# Patient Record
Sex: Female | Born: 1950
Health system: Southern US, Community
[De-identification: ages and names within clinical notes are randomized; demographics above are authoritative.]

## PROBLEM LIST (undated history)

## (undated) DIAGNOSIS — M810 Age-related osteoporosis without current pathological fracture: Secondary | ICD-10-CM

## (undated) DIAGNOSIS — G4730 Sleep apnea, unspecified: Secondary | ICD-10-CM

## (undated) DIAGNOSIS — M1990 Unspecified osteoarthritis, unspecified site: Secondary | ICD-10-CM

## (undated) DIAGNOSIS — F419 Anxiety disorder, unspecified: Secondary | ICD-10-CM

## (undated) HISTORY — DX: Sleep apnea, unspecified: G47.30

## (undated) HISTORY — PX: ABDOMINAL HYSTERECTOMY: SHX81

## (undated) HISTORY — DX: Anxiety disorder, unspecified: F41.9

## (undated) HISTORY — DX: Age-related osteoporosis without current pathological fracture: M81.0

## (undated) HISTORY — DX: Unspecified osteoarthritis, unspecified site: M19.90

## (undated) HISTORY — PX: POLYPECTOMY: SHX149

---

## 1983-11-03 HISTORY — PX: FINGER SURGERY: SHX640

## 1999-03-06 ENCOUNTER — Other Ambulatory Visit: Admission: RE | Admit: 1999-03-06 | Discharge: 1999-03-06 | Payer: Self-pay | Admitting: Orthopedic Surgery

## 2000-01-16 ENCOUNTER — Ambulatory Visit (HOSPITAL_COMMUNITY): Admission: RE | Admit: 2000-01-16 | Discharge: 2000-01-16 | Payer: Self-pay | Admitting: Gastroenterology

## 2000-01-16 ENCOUNTER — Encounter (INDEPENDENT_AMBULATORY_CARE_PROVIDER_SITE_OTHER): Payer: Self-pay | Admitting: Specialist

## 2000-11-08 ENCOUNTER — Encounter: Payer: Self-pay | Admitting: Family Medicine

## 2000-11-08 ENCOUNTER — Encounter: Admission: RE | Admit: 2000-11-08 | Discharge: 2000-11-08 | Payer: Self-pay | Admitting: Family Medicine

## 2001-12-21 ENCOUNTER — Encounter: Payer: Self-pay | Admitting: Family Medicine

## 2001-12-21 ENCOUNTER — Encounter: Admission: RE | Admit: 2001-12-21 | Discharge: 2001-12-21 | Payer: Self-pay | Admitting: Family Medicine

## 2004-11-02 DIAGNOSIS — G4730 Sleep apnea, unspecified: Secondary | ICD-10-CM

## 2004-11-02 HISTORY — DX: Sleep apnea, unspecified: G47.30

## 2005-03-13 ENCOUNTER — Ambulatory Visit: Payer: Self-pay | Admitting: Gastroenterology

## 2005-03-27 ENCOUNTER — Ambulatory Visit: Payer: Self-pay | Admitting: Gastroenterology

## 2008-08-01 ENCOUNTER — Emergency Department (HOSPITAL_BASED_OUTPATIENT_CLINIC_OR_DEPARTMENT_OTHER): Admission: EM | Admit: 2008-08-01 | Discharge: 2008-08-01 | Payer: Self-pay | Admitting: Emergency Medicine

## 2009-11-02 HISTORY — PX: COLONOSCOPY: SHX174

## 2010-03-26 ENCOUNTER — Encounter (INDEPENDENT_AMBULATORY_CARE_PROVIDER_SITE_OTHER): Payer: Self-pay | Admitting: *Deleted

## 2010-04-10 ENCOUNTER — Encounter (INDEPENDENT_AMBULATORY_CARE_PROVIDER_SITE_OTHER): Payer: Self-pay | Admitting: *Deleted

## 2010-04-17 ENCOUNTER — Encounter (INDEPENDENT_AMBULATORY_CARE_PROVIDER_SITE_OTHER): Payer: Self-pay

## 2010-04-18 ENCOUNTER — Ambulatory Visit: Payer: Self-pay | Admitting: Gastroenterology

## 2010-04-28 ENCOUNTER — Ambulatory Visit: Payer: Self-pay | Admitting: Gastroenterology

## 2010-04-30 ENCOUNTER — Encounter: Payer: Self-pay | Admitting: Gastroenterology

## 2010-09-01 ENCOUNTER — Emergency Department (HOSPITAL_BASED_OUTPATIENT_CLINIC_OR_DEPARTMENT_OTHER): Admission: EM | Admit: 2010-09-01 | Discharge: 2010-09-02 | Payer: Self-pay | Admitting: Emergency Medicine

## 2010-09-01 ENCOUNTER — Ambulatory Visit: Payer: Self-pay | Admitting: Diagnostic Radiology

## 2010-12-02 NOTE — Miscellaneous (Signed)
Summary: Lec previsit  Clinical Lists Changes  Medications: Added new medication of MOVIPREP 100 GM  SOLR (PEG-KCL-NACL-NASULF-NA ASC-C) As per prep instructions. - Signed Rx of MOVIPREP 100 GM  SOLR (PEG-KCL-NACL-NASULF-NA ASC-C) As per prep instructions.;  #1 x 0;  Signed;  Entered by: Ulis Rias RN;  Authorized by: Mardella Layman MD [facility];  Method used: [facility]  [id number]*, [address] [place], [place]  [address], Ph: [phone] or [phone], Fax: [phone] Observations: Added new observation of NKA: T (04/18/2010 16:23)    Prescriptions: MOVIPREP 100 GM  SOLR (PEG-KCL-NACL-NASULF-NA ASC-C) As per prep instructions.  #1 x 0   Entered by:   Ulis Rias RN   Authorized by:   Mardella Layman MD [facility]   Signed by:   Ulis Rias RN on 04/18/2010   Method used:   [facility]ronically to        [facility]  [phone]* (retail)       [address]       [place], [place]  [address]       Ph: [phone] or [phone]       Fax: [phone]   RxID:   [id number]

## 2010-12-02 NOTE — Letter (Signed)
Summary: [facility] Instructions  [facility]  [address] [place], [place] [address]   Phone: [phone]  Fax: [phone]       Rebekah Chapman    [birth year]    MRN: [id number]        Procedure Day /Date: Monday 04/28/2010     Arrival Time: 9:00 am      Procedure Time: 10:00 am     Location of Procedure:                    _x _  [facility] (4th Floor)                        PREPARATION FOR COLONOSCOPY WITH MOVIPREP   Starting 5 days prior to your procedure Wednesday 6/22 do not eat nuts, seeds, popcorn, corn, beans, peas,  salads, or any raw vegetables.  Do not take any fiber supplements (e.g. Metamucil, Citrucel, and Benefiber).  THE DAY BEFORE YOUR PROCEDURE         DATE: Sunday 6/26  1.  Drink clear liquids the entire day-NO SOLID FOOD  2.  Do not drink anything colored red or purple.  Avoid juices with pulp.  No orange juice.  3.  Drink at least 64 oz. (8 glasses) of fluid/clear liquids during the day to prevent dehydration and help the prep work efficiently.  CLEAR LIQUIDS INCLUDE: Water Jello Ice Popsicles Tea (sugar ok, no milk/cream) Powdered fruit flavored drinks Coffee (sugar ok, no milk/cream) Gatorade Juice: apple, white grape, white cranberry  Lemonade Clear bullion, consomm, broth Carbonated beverages (any kind) Strained chicken noodle soup Hard Candy                             4.  In the morning, mix first dose of MoviPrep solution:    Empty 1 Pouch A and 1 Pouch B into the disposable container    Add lukewarm drinking water to the top line of the container. Mix to dissolve    Refrigerate (mixed solution should be used within 24 hrs)  5.  Begin drinking the prep at 5:00 p.m. The MoviPrep container is divided by 4 marks.   Every 15 minutes drink the solution down to the next mark (approximately 8 oz) until the full liter is complete.   6.  Follow completed prep with 16 oz of clear liquid of your choice  (Nothing red or purple).  Continue to drink clear liquids until bedtime.  7.  Before going to bed, mix second dose of MoviPrep solution:    Empty 1 Pouch A and 1 Pouch B into the disposable container    Add lukewarm drinking water to the top line of the container. Mix to dissolve    Refrigerate  THE DAY OF YOUR PROCEDURE      DATE: Monday 6/27  Beginning at 5:00 a.m. (5 hours before procedure):         1. Every 15 minutes, drink the solution down to the next mark (approx 8 oz) until the full liter is complete.  2. Follow completed prep with 16 oz. of clear liquid of your choice.    3. You may drink clear liquids until 8:00 am (2 HOURS BEFORE PROCEDURE).   MEDICATION INSTRUCTIONS  Unless otherwise instructed, you should take regular prescription medications with a small sip of water   as early as possible the morning of your  procedure.         OTHER INSTRUCTIONS  You will need a responsible adult at least 59 years of age to accompany you and drive you home.   This person must remain in the waiting room during your procedure.  Wear loose fitting clothing that is easily removed.  Leave jewelry and other valuables at home.  However, you may wish to bring a book to read or  an iPod/MP3 player to listen to music as you wait for your procedure to start.  Remove all body piercing jewelry and leave at home.  Total time from sign-in until discharge is approximately 2-3 hours.  You should go home directly after your procedure and rest.  You can resume normal activities the  day after your procedure.  The day of your procedure you should not:   Drive   Make legal decisions   Operate machinery   Drink alcohol   Return to work  You will receive specific instructions about eating, activities and medications before you leave.    The above instructions have been reviewed and explained to me by   Ulis Rias RN  April 18, 2010 4:50 PM     I fully understand and can  verbalize these instructions _____________________________ Date _________

## 2010-12-02 NOTE — Procedures (Signed)
Summary: Colonoscopy  Patient: [name] Note: All result statuses are Final unless otherwise noted.  Tests: (1) Colonoscopy (COL)   COL Colonoscopy           DONE (C)     [facility]     [address]     [place], [place]  [address]           COLONOSCOPY PROCEDURE REPORT           PATIENT:  Rebekah Chapman, Rebekah Chapman  MR#:  [id number]     BIRTHDATE:  May 10, 1951, 59 yrs. old  GENDER:  female     ENDOSCOPIST:  Vania Rea. Jarold Motto, MD, [facility]     REF. BY:     PROCEDURE DATE:  04/28/2010     PROCEDURE:  Colonoscopy with biopsy     ASA CLASS:  Class II     INDICATIONS:  history of pre-cancerous (adenomatous) colon polyps           MEDICATIONS:   Fentanyl 75 mcg IV, Versed 8 mg IV           DESCRIPTION OF PROCEDURE:   After the risks benefits and     alternatives of the procedure were thoroughly explained, informed     consent was obtained.  Digital rectal exam was performed and     revealed no abnormalities.   The [id number] [id number] endoscope was     introduced through the anus and advanced to the cecum, which was     identified by both the appendix and ileocecal valve, without     limitations.  The quality of the prep was excellent, using     MoviPrep.  The instrument was then slowly withdrawn as the colon     was fully examined.     <<PROCEDUREIMAGES>>           FINDINGS:  A sessile polyp was found. 2 mm diminutive polyp cold     excised at hepatic flexure area.  This was otherwise a normal     examination of the colon.   Retroflexed views in the rectum     revealed no abnormalities.    The scope was then withdrawn from     the patient and the procedure completed.           COMPLICATIONS:  None     ENDOSCOPIC IMPRESSION:     1) Sessile polyp     2) Otherwise normal examination     r/o adenoma     RECOMMENDATIONS:     1) Follow up colonoscopy in 5 years     REPEAT EXAM:  No           ______________________________     Vania Rea. Jarold Motto, MD, [facility]           CC:  Marinda Elk, MD           n.     REVISED:  04/28/2010 10:03 AM     eSIGNED:   Vania Rea. Patterson at 04/28/2010 10:03 AM           Lauretta Grill, [id number]  Note: An exclamation mark (!) indicates a result that was not dispersed into the flowsheet. Document Creation Date: 04/28/2010 10:05 AM _______________________________________________________________________  (1) Order result status: Final Collection or observation date-time: 04/28/2010 09:52 Requested date-time:  Receipt date-time:  Reported date-time:  Referring Physician:   Ordering Physician: Sheryn Bison [phone]) Specimen Source:  Source: Launa Grill Order Number: [id number] Lab site:  Appended Document: Colonoscopy     Procedures Next Due Date:    Colonoscopy: 04/2015

## 2010-12-02 NOTE — Letter (Signed)
Summary: Colonoscopy Letter  [facility]  [address] [place], [place] [address]   Phone: [phone]  Fax: [phone]      Mar 26, 2010 MRN: [id number]   [facility] [address] [place], [place]  [address]   Dear Ms. Swamy,   According to your medical record, it is time for you to schedule a Colonoscopy. The American Cancer Society recommends this procedure as a method to detect early colon cancer. Patients with a family history of colon cancer, or a personal history of colon polyps or inflammatory bowel disease are at increased risk.  This letter has beeen generated based on the recommendations made at the time of your procedure. If you feel that in your particular situation this may no longer apply, please contact our office.  Please call our office at [phone] to schedule this appointment or to update your records at your earliest convenience.  Thank you for cooperating with [place] to provide you with the very best care possible.   Sincerely,    Vania Rea. Jarold Motto, M.D.  [facility] [phone]

## 2010-12-02 NOTE — Letter (Signed)
Summary: Previsit letter  [facility]  [address] [place], [place] [address]   Phone: [phone]  Fax: [phone]       04/10/2010 MRN: [id number]  [facility] [address] [place], [place]  [address]  Dear Ms. Dendinger,  Welcome to the Gastroenterology Division at [facility].    You are scheduled to see a nurse for your pre-procedure visit on 04-18-10 at 4:30p.m. on the 3rd floor at [facility], [address]  We ask that you try to arrive at our office 15 minutes prior to your appointment time to allow for check-in.  Your nurse visit will consist of discussing your medical and surgical history, your immediate family medical history, and your medications.    Please bring a complete list of all your medications or, if you prefer, bring the medication bottles and we will list them.  We will need to be aware of both prescribed and over the counter drugs.  We will need to know exact dosage information as well.  If you are on blood thinners (Coumadin, Plavix, Aggrenox, Ticlid, etc.) please call our office today/prior to your appointment, as we need to consult with your physician about holding your medication.   Please be prepared to read and sign documents such as consent forms, a financial agreement, and acknowledgement forms.  If necessary, and with your consent, a friend or relative is welcome to sit-in on the nurse visit with you.  Please bring your insurance card so that we may make a copy of it.  If your insurance requires a referral to see a specialist, please bring your referral form from your primary care physician.  No co-pay is required for this nurse visit.     If you cannot keep your appointment, please call [phone] to cancel or reschedule prior to your appointment date.  This allows [place] the opportunity to schedule an appointment for another patient in need of care.    Thank you for choosing [facility] for your medical  needs.  We appreciate the opportunity to care for you.  Please visit [place] at our website  to learn more about our practice.                     Sincerely.                                                                                                                   The Gastroenterology Division

## 2010-12-02 NOTE — Letter (Signed)
Summary: Patient Notice- Polyp Results  [facility]  [address] [place], [place] [address]   Phone: [phone]  Fax: [phone]        April 30, 2010 MRN: [id number]    [facility] [address] [place], [place]  [address]    Dear Rebekah Chapman,  I am pleased to inform you that the colon polyp(s) removed during your recent colonoscopy was (were) found to be benign (no cancer detected) upon pathologic examination.  I recommend you have a repeat colonoscopy examination in 5_ years to look for recurrent polyps, as having colon polyps increases your risk for having recurrent polyps or even colon cancer in the future.  Should you develop new or worsening symptoms of abdominal pain, bowel habit changes or bleeding from the rectum or bowels, please schedule an evaluation with either your primary care physician or with me.  Additional information/recommendations:  __ No further action with gastroenterology is needed at this time. Please      follow-up with your primary care physician for your other healthcare      needs.  __ Please call [phone] to schedule a return visit to review your      situation.  __ Please keep your follow-up visit as already scheduled.  _X_ Continue treatment plan as outlined the day of your exam.  Please call us if you are having persistent problems or have questions about your condition that have not been fully answered at this time.  Sincerely,  Mardella Layman MD [facility]  This letter has been electronically signed by your physician.  Appended Document: Patient Notice- Polyp Results letter mailed 7.6.11

## 2011-01-14 LAB — DIFFERENTIAL
Basophils Relative: 2 % — ABNORMAL HIGH (ref 0–1)
Lymphocytes Relative: 32 % (ref 12–46)
Lymphs Abs: 2.1 10*3/uL (ref 0.7–4.0)
Monocytes Relative: 11 % (ref 3–12)
Neutro Abs: 3.6 10*3/uL (ref 1.7–7.7)
Neutrophils Relative %: 53 % (ref 43–77)

## 2011-01-14 LAB — CBC
HCT: 35.9 % — ABNORMAL LOW (ref 36.0–46.0)
MCHC: 35.9 g/dL (ref 30.0–36.0)
MCV: 97.8 fL (ref 78.0–100.0)
Platelets: 159 10*3/uL (ref 150–400)
RDW: 11.9 % (ref 11.5–15.5)

## 2011-01-14 LAB — URINALYSIS, ROUTINE W REFLEX MICROSCOPIC
Glucose, UA: NEGATIVE mg/dL
Hgb urine dipstick: NEGATIVE
Ketones, ur: NEGATIVE mg/dL
Protein, ur: NEGATIVE mg/dL
Urobilinogen, UA: 1 mg/dL (ref 0.0–1.0)

## 2011-01-14 LAB — URINE CULTURE
Colony Count: 10000
Culture  Setup Time: 201111010736

## 2011-01-14 LAB — COMPREHENSIVE METABOLIC PANEL
Albumin: 4 g/dL (ref 3.5–5.2)
Alkaline Phosphatase: 79 U/L (ref 39–117)
BUN: 12 mg/dL (ref 6–23)
Calcium: 9.7 mg/dL (ref 8.4–10.5)
Creatinine, Ser: 0.9 mg/dL (ref 0.4–1.2)
Glucose, Bld: 97 mg/dL (ref 70–99)
Total Protein: 6.9 g/dL (ref 6.0–8.3)

## 2011-01-14 LAB — PREGNANCY, URINE: Preg Test, Ur: NEGATIVE

## 2011-01-14 LAB — URINE MICROSCOPIC-ADD ON

## 2011-03-20 NOTE — Procedures (Signed)
[facility]  Patient:    Rebekah Chapman, Rebekah Chapman                    MRN: [id number] Proc. Date: 01/16/00 Adm. Date:  [id number] Disc. Date: [id number] Attending:  Orland Mustard CC:         Elana Alm. Eliezer Lofts., M.D., [facility] [place] [facility]                           Procedure Report  PROCEDURE:  Colonoscopy with polypectomy.  ENDOSCOPIST:  Llana Aliment. Edwards, M.D.  MEDICATIONS:  Fentanyl 50 mcg and Versed 5 mg IV.  SCOPE:  Olympus pediatric video colonoscope.  INDICATIONS:  Strong family history of colon cancer.  DESCRIPTION OF PROCEDURE:  The procedure had been explained to the patient and consent obtained.  With the patient in the left lateral decubitus position, the  Olympus video endoscope of pediatric size was inserted and advanced under direct visualization.  The prep was excellent and we were able to advance to the cecum  without difficulty.  Upon withdrawing to the mid ascending colon, 0.5 cm polyp nd three-quarter centimeter polyp was removed and sucked through the scope.  The transverse colon was free of polyps, as was the descending colon.  In the sigmoid, a three-quarter centimeter was removed and sucked through the scope.  In the rectum, a 1.5 cm sessile polyp was encountered.  It was removed in two pieces and sucked through the scope.  No bleeding in any of the polypectomy sites.  The scope was withdrawn.  The patient tolerated the procedure well.  ASSESSMENT:  Multiple colon polyps, removed.  PLAN:  Will recommend repeating procedure in two years and routine post polypectomy instructions. DD:  01/16/00 TD:  01/18/00 Job: [id number]

## 2012-08-04 ENCOUNTER — Other Ambulatory Visit: Payer: Self-pay | Admitting: Family Medicine

## 2012-08-04 DIAGNOSIS — Z1231 Encounter for screening mammogram for malignant neoplasm of breast: Secondary | ICD-10-CM

## 2012-08-05 ENCOUNTER — Ambulatory Visit
Admission: RE | Admit: 2012-08-05 | Discharge: 2012-08-05 | Disposition: A | Discharge status: Home / Self Care | Payer: PRIVATE HEALTH INSURANCE | Source: Ambulatory Visit | Attending: Family Medicine | Admitting: Family Medicine

## 2012-08-05 DIAGNOSIS — Z1231 Encounter for screening mammogram for malignant neoplasm of breast: Secondary | ICD-10-CM

## 2012-11-02 HISTORY — PX: ANKLE SURGERY: SHX546

## 2014-06-12 ENCOUNTER — Other Ambulatory Visit: Payer: Self-pay

## 2014-06-12 ENCOUNTER — Other Ambulatory Visit: Payer: Self-pay | Admitting: Family Medicine

## 2014-06-12 DIAGNOSIS — N63 Unspecified lump in unspecified breast: Secondary | ICD-10-CM

## 2014-06-19 ENCOUNTER — Ambulatory Visit
Admission: RE | Admit: 2014-06-19 | Discharge: 2014-06-19 | Disposition: A | Discharge status: Home / Self Care | Payer: BC Managed Care – PPO | Source: Ambulatory Visit | Attending: Family Medicine | Admitting: Family Medicine

## 2014-06-19 ENCOUNTER — Encounter (INDEPENDENT_AMBULATORY_CARE_PROVIDER_SITE_OTHER): Payer: Self-pay

## 2014-06-19 DIAGNOSIS — N63 Unspecified lump in unspecified breast: Secondary | ICD-10-CM

## 2015-03-11 ENCOUNTER — Encounter: Payer: Self-pay | Admitting: Internal Medicine

## 2015-04-08 ENCOUNTER — Encounter: Payer: Self-pay | Admitting: Internal Medicine

## 2015-06-07 ENCOUNTER — Ambulatory Visit (AMBULATORY_SURGERY_CENTER): Payer: Self-pay

## 2015-06-07 VITALS — Ht 61.5 in | Wt 117.0 lb

## 2015-06-07 DIAGNOSIS — Z860100 Personal history of colon polyps, unspecified: Secondary | ICD-10-CM

## 2015-06-07 MED ORDER — NA SULFATE-K SULFATE-MG SULF 17.5-3.13-1.6 GM/177ML PO SOLN: 1.0000 | Freq: Once | ORAL | Status: DC

## 2015-06-07 NOTE — Progress Notes (Signed)
Hx of post op nausea/vomitting No egg or soy allergies Not on home 02 Pt doesn't take diet drugs

## 2015-06-21 ENCOUNTER — Ambulatory Visit (AMBULATORY_SURGERY_CENTER): Payer: Commercial Managed Care - HMO | Admitting: Internal Medicine

## 2015-06-21 ENCOUNTER — Encounter: Payer: Self-pay | Admitting: Internal Medicine

## 2015-06-21 VITALS — BP 120/72 | HR 61 | Temp 96.4°F | Resp 29 | Ht 61.0 in | Wt 117.0 lb

## 2015-06-21 DIAGNOSIS — Z860100 Personal history of colon polyps, unspecified: Secondary | ICD-10-CM

## 2015-06-21 DIAGNOSIS — D123 Benign neoplasm of transverse colon: Secondary | ICD-10-CM

## 2015-06-21 DIAGNOSIS — D120 Benign neoplasm of cecum: Secondary | ICD-10-CM

## 2015-06-21 DIAGNOSIS — D122 Benign neoplasm of ascending colon: Secondary | ICD-10-CM

## 2015-06-21 MED ORDER — SODIUM CHLORIDE 0.9 % IV SOLN: 500.0000 mL | INTRAVENOUS | Status: DC

## 2015-06-21 NOTE — Op Note (Signed)
[facility]  [address] [place], [address]   COLONOSCOPY PROCEDURE REPORT  PATIENT: Rebekah Chapman, Rebekah Chapman  MR#: [id number] BIRTHDATE: 1951/05/31 , 81  yrs. old GENDER: female ENDOSCOPIST: Jerene Bears, MD REFERRED EW:YBRKV R Patterson, M.D. PROCEDURE DATE:  06/21/2015 PROCEDURE:   Colonoscopy, surveillance and Colonoscopy with snare polypectomy First Screening Colonoscopy - Avg.  risk and is 50 yrs.  old or older - No.  Prior Negative Screening - Now for repeat screening. N/A  History of Adenoma - Now for follow-up colonoscopy & has been > or = to 3 yrs.  Yes hx of adenoma.  Has been 3 or more years since last colonoscopy.  Polyps removed today? Yes ASA CLASS:   Class II INDICATIONS:Surveillance due to prior colonic neoplasia and PH Colon Adenoma. MEDICATIONS: Monitored anesthesia care and Propofol 200 mg IV  DESCRIPTION OF PROCEDURE:   After the risks benefits and alternatives of the procedure were thoroughly explained, informed consent was obtained.  The digital rectal exam revealed no rectal mass.   The LB PFC-H190 [id number]  endoscope was introduced through the anus and advanced to the cecum, which was identified by both the appendix and ileocecal valve. No adverse events experienced. The quality of the prep was good.  (Suprep was used)  The instrument was then slowly withdrawn as the colon was fully examined. Estimated blood loss is zero unless otherwise noted in this procedure report.  COLON FINDINGS: Five sessile polyps ranging from 5 to 73m in size were found at the cecum (1), in the ascending colon (3), and transverse colon (1).  Polypectomies were performed with a cold snare.  The resection was complete, the polyp tissue was completely retrieved and sent to histology.   There was mild diverticulosis noted in the transverse colon and left colon.  Retroflexed views revealed internal hemorrhoids. The time to cecum = 8.2 Withdrawal time = 9.3   The scope  was withdrawn and the procedure completed.  COMPLICATIONS: There were no immediate complications.  ENDOSCOPIC IMPRESSION: 1.   Five sessile polyps ranging from 5 to 852min size were found at the cecum, in the ascending colon, and transverse colon; polypectomies were performed with a cold snare 2.   Mild diverticulosis was noted in the transverse colon and left colon  RECOMMENDATIONS: 1.  Await pathology results 2.  High fiber diet 3.  Timing of repeat colonoscopy will be determined by pathology findings. 4.  You will receive a letter within 1-2 weeks with the results of your biopsy as well as final recommendations.  Please call my office if you have not received a letter after 3 weeks.  eSigned:  JaJerene BearsMD 06/21/2015 10:16 AM   cc: RoBriscoe DeutscherMD and The Patient   PATIENT NAME:  GaZasha, BelleauR#: [id number]

## 2015-06-21 NOTE — Patient Instructions (Signed)
YOU HAD AN ENDOSCOPIC PROCEDURE TODAY AT [facility]:   Refer to the procedure report that was given to you for any specific questions about what was found during the examination.  If the procedure report does not answer your questions, please call your gastroenterologist to clarify.  If you requested that your care partner not be given the details of your procedure findings, then the procedure report has been included in a sealed envelope for you to review at your convenience later.  YOU SHOULD EXPECT: Some feelings of bloating in the abdomen. Passage of more gas than usual.  Walking can help get rid of the air that was put into your GI tract during the procedure and reduce the bloating. If you had a lower endoscopy (such as a colonoscopy or flexible sigmoidoscopy) you may notice spotting of blood in your stool or on the toilet paper. If you underwent a bowel prep for your procedure, you may not have a normal bowel movement for a few days.  Please Note:  You might notice some irritation and congestion in your nose or some drainage.  This is from the oxygen used during your procedure.  There is no need for concern and it should clear up in a day or so.  SYMPTOMS TO REPORT IMMEDIATELY:   Following lower endoscopy (colonoscopy or flexible sigmoidoscopy):  Excessive amounts of blood in the stool  Significant tenderness or worsening of abdominal pains  Swelling of the abdomen that is new, acute  Fever of 100F or higher    For urgent or emergent issues, a gastroenterologist can be reached at any hour by calling [phone].   DIET: Your first meal following the procedure should be a small meal and then it is ok to progress to your normal diet. Heavy or fried foods are harder to digest and may make you feel nauseous or bloated.  Likewise, meals heavy in dairy and vegetables can increase bloating.  Drink plenty of fluids but you should avoid alcoholic beverages for 24  hours.  ACTIVITY:  You should plan to take it easy for the rest of today and you should NOT DRIVE or use heavy machinery until tomorrow (because of the sedation medicines used during the test).    FOLLOW UP: Our staff will call the number listed on your records the next business day following your procedure to check on you and address any questions or concerns that you may have regarding the information given to you following your procedure. If we do not reach you, we will leave a message.  However, if you are feeling well and you are not experiencing any problems, there is no need to return our call.  We will assume that you have returned to your regular daily activities without incident.  If any biopsies were taken you will be contacted by phone or by letter within the next 1-3 weeks.  Please call us at [phone] if you have not heard about the biopsies in 3 weeks.    SIGNATURES/CONFIDENTIALITY: You and/or your care partner have signed paperwork which will be entered into your electronic medical record.  These signatures attest to the fact that that the information above on your After Visit Summary has been reviewed and is understood.  Full responsibility of the confidentiality of this discharge information lies with you and/or your care-partner.   Information on polyps ,diverticulosis ,& high fiber diet given to you today

## 2015-06-21 NOTE — Progress Notes (Signed)
Report to PACU, RN, vss, BBS= Clear.  

## 2015-06-21 NOTE — Progress Notes (Signed)
Called to room to assist during endoscopic procedure.  Patient ID and intended procedure confirmed with present staff. Received instructions for my participation in the procedure from the performing physician.  

## 2015-06-24 ENCOUNTER — Telehealth: Payer: Self-pay

## 2015-06-24 NOTE — Telephone Encounter (Signed)
  Follow up Call-  Call back number 06/21/2015  Post procedure Call Back phone  # [phone]  Permission to leave phone message Yes     Patient questions:  Do you have a fever, pain , or abdominal swelling? No. Pain Score  0 *  Have you tolerated food without any problems? Yes.    Have you been able to return to your normal activities? Yes.    Do you have any questions about your discharge instructions: Diet   No. Medications  No. Follow up visit  No.  Do you have questions or concerns about your Care? No.  Actions: * If pain score is 4 or above: No action needed, pain <4.

## 2015-06-25 ENCOUNTER — Encounter: Payer: Self-pay | Admitting: Internal Medicine

## 2017-04-28 DIAGNOSIS — G894 Chronic pain syndrome: Secondary | ICD-10-CM | POA: Diagnosis not present

## 2017-04-28 DIAGNOSIS — Z0000 Encounter for general adult medical examination without abnormal findings: Secondary | ICD-10-CM | POA: Diagnosis not present

## 2017-04-28 DIAGNOSIS — Z1231 Encounter for screening mammogram for malignant neoplasm of breast: Secondary | ICD-10-CM | POA: Diagnosis not present

## 2017-04-28 DIAGNOSIS — G479 Sleep disorder, unspecified: Secondary | ICD-10-CM | POA: Diagnosis not present

## 2017-04-28 DIAGNOSIS — M810 Age-related osteoporosis without current pathological fracture: Secondary | ICD-10-CM | POA: Diagnosis not present

## 2017-04-28 DIAGNOSIS — E782 Mixed hyperlipidemia: Secondary | ICD-10-CM | POA: Diagnosis not present

## 2017-04-28 DIAGNOSIS — E559 Vitamin D deficiency, unspecified: Secondary | ICD-10-CM | POA: Diagnosis not present

## 2017-07-23 DIAGNOSIS — G4733 Obstructive sleep apnea (adult) (pediatric): Secondary | ICD-10-CM | POA: Diagnosis not present

## 2017-07-23 DIAGNOSIS — Z23 Encounter for immunization: Secondary | ICD-10-CM | POA: Diagnosis not present

## 2017-07-23 DIAGNOSIS — F17210 Nicotine dependence, cigarettes, uncomplicated: Secondary | ICD-10-CM | POA: Diagnosis not present

## 2017-08-09 DIAGNOSIS — M25571 Pain in right ankle and joints of right foot: Secondary | ICD-10-CM | POA: Diagnosis not present

## 2017-08-09 DIAGNOSIS — M810 Age-related osteoporosis without current pathological fracture: Secondary | ICD-10-CM | POA: Diagnosis not present

## 2017-08-09 DIAGNOSIS — G894 Chronic pain syndrome: Secondary | ICD-10-CM | POA: Diagnosis not present

## 2017-08-09 DIAGNOSIS — Z136 Encounter for screening for cardiovascular disorders: Secondary | ICD-10-CM | POA: Diagnosis not present

## 2017-08-09 DIAGNOSIS — E559 Vitamin D deficiency, unspecified: Secondary | ICD-10-CM | POA: Diagnosis not present

## 2017-08-09 DIAGNOSIS — G4730 Sleep apnea, unspecified: Secondary | ICD-10-CM | POA: Diagnosis not present

## 2017-08-09 DIAGNOSIS — G479 Sleep disorder, unspecified: Secondary | ICD-10-CM | POA: Diagnosis not present

## 2017-08-09 DIAGNOSIS — E782 Mixed hyperlipidemia: Secondary | ICD-10-CM | POA: Diagnosis not present

## 2017-08-13 DIAGNOSIS — Z87891 Personal history of nicotine dependence: Secondary | ICD-10-CM | POA: Diagnosis not present

## 2017-08-13 DIAGNOSIS — F17210 Nicotine dependence, cigarettes, uncomplicated: Secondary | ICD-10-CM | POA: Diagnosis not present

## 2017-10-12 DIAGNOSIS — W000XXA Fall on same level due to ice and snow, initial encounter: Secondary | ICD-10-CM | POA: Diagnosis not present

## 2017-10-12 DIAGNOSIS — Z888 Allergy status to other drugs, medicaments and biological substances status: Secondary | ICD-10-CM | POA: Diagnosis not present

## 2017-10-12 DIAGNOSIS — M25531 Pain in right wrist: Secondary | ICD-10-CM | POA: Diagnosis not present

## 2017-10-12 DIAGNOSIS — S52501A Unspecified fracture of the lower end of right radius, initial encounter for closed fracture: Secondary | ICD-10-CM | POA: Diagnosis not present

## 2017-10-12 DIAGNOSIS — F17210 Nicotine dependence, cigarettes, uncomplicated: Secondary | ICD-10-CM | POA: Diagnosis not present

## 2017-10-12 DIAGNOSIS — S52591A Other fractures of lower end of right radius, initial encounter for closed fracture: Secondary | ICD-10-CM | POA: Diagnosis not present

## 2017-10-13 DIAGNOSIS — M25531 Pain in right wrist: Secondary | ICD-10-CM | POA: Diagnosis not present

## 2017-10-13 DIAGNOSIS — S52531A Colles' fracture of right radius, initial encounter for closed fracture: Secondary | ICD-10-CM | POA: Diagnosis not present

## 2017-10-21 DIAGNOSIS — F17210 Nicotine dependence, cigarettes, uncomplicated: Secondary | ICD-10-CM | POA: Diagnosis not present

## 2017-10-21 DIAGNOSIS — R918 Other nonspecific abnormal finding of lung field: Secondary | ICD-10-CM | POA: Diagnosis not present

## 2017-10-21 DIAGNOSIS — G4733 Obstructive sleep apnea (adult) (pediatric): Secondary | ICD-10-CM | POA: Diagnosis not present

## 2017-11-03 DIAGNOSIS — S52531D Colles' fracture of right radius, subsequent encounter for closed fracture with routine healing: Secondary | ICD-10-CM | POA: Diagnosis not present

## 2017-11-03 DIAGNOSIS — R52 Pain, unspecified: Secondary | ICD-10-CM | POA: Diagnosis not present

## 2017-11-08 DIAGNOSIS — G894 Chronic pain syndrome: Secondary | ICD-10-CM | POA: Diagnosis not present

## 2017-11-24 DIAGNOSIS — R52 Pain, unspecified: Secondary | ICD-10-CM | POA: Diagnosis not present

## 2017-11-24 DIAGNOSIS — S52531A Colles' fracture of right radius, initial encounter for closed fracture: Secondary | ICD-10-CM | POA: Diagnosis not present

## 2017-12-27 DIAGNOSIS — R52 Pain, unspecified: Secondary | ICD-10-CM | POA: Diagnosis not present

## 2017-12-27 DIAGNOSIS — S52531D Colles' fracture of right radius, subsequent encounter for closed fracture with routine healing: Secondary | ICD-10-CM | POA: Diagnosis not present

## 2018-01-13 DIAGNOSIS — J209 Acute bronchitis, unspecified: Secondary | ICD-10-CM | POA: Diagnosis not present

## 2018-02-07 DIAGNOSIS — Z87891 Personal history of nicotine dependence: Secondary | ICD-10-CM | POA: Diagnosis not present

## 2018-02-07 DIAGNOSIS — J439 Emphysema, unspecified: Secondary | ICD-10-CM | POA: Diagnosis not present

## 2018-02-07 DIAGNOSIS — R918 Other nonspecific abnormal finding of lung field: Secondary | ICD-10-CM | POA: Diagnosis not present

## 2018-03-08 DIAGNOSIS — G894 Chronic pain syndrome: Secondary | ICD-10-CM | POA: Diagnosis not present

## 2018-03-08 DIAGNOSIS — Z1239 Encounter for other screening for malignant neoplasm of breast: Secondary | ICD-10-CM | POA: Diagnosis not present

## 2018-03-08 DIAGNOSIS — G479 Sleep disorder, unspecified: Secondary | ICD-10-CM | POA: Diagnosis not present

## 2018-03-15 ENCOUNTER — Other Ambulatory Visit: Payer: Self-pay | Admitting: Family Medicine

## 2018-03-15 DIAGNOSIS — Z1231 Encounter for screening mammogram for malignant neoplasm of breast: Secondary | ICD-10-CM

## 2018-03-23 DIAGNOSIS — G4733 Obstructive sleep apnea (adult) (pediatric): Secondary | ICD-10-CM | POA: Diagnosis not present

## 2018-03-23 DIAGNOSIS — R918 Other nonspecific abnormal finding of lung field: Secondary | ICD-10-CM | POA: Diagnosis not present

## 2018-03-23 DIAGNOSIS — F17210 Nicotine dependence, cigarettes, uncomplicated: Secondary | ICD-10-CM | POA: Diagnosis not present

## 2018-03-23 DIAGNOSIS — R911 Solitary pulmonary nodule: Secondary | ICD-10-CM | POA: Diagnosis not present

## 2018-03-24 ENCOUNTER — Encounter: Payer: Self-pay | Admitting: *Deleted

## 2018-04-05 ENCOUNTER — Ambulatory Visit
Admission: RE | Admit: 2018-04-05 | Discharge: 2018-04-05 | Disposition: A | Discharge status: Home / Self Care | Payer: Self-pay | Source: Ambulatory Visit | Attending: Family Medicine | Admitting: Family Medicine

## 2018-04-05 DIAGNOSIS — Z1231 Encounter for screening mammogram for malignant neoplasm of breast: Secondary | ICD-10-CM | POA: Diagnosis not present

## 2018-04-22 DIAGNOSIS — G4733 Obstructive sleep apnea (adult) (pediatric): Secondary | ICD-10-CM | POA: Diagnosis not present

## 2018-04-24 ENCOUNTER — Encounter: Payer: Self-pay | Admitting: Internal Medicine

## 2018-06-08 DIAGNOSIS — M5410 Radiculopathy, site unspecified: Secondary | ICD-10-CM | POA: Diagnosis not present

## 2018-06-08 DIAGNOSIS — G4733 Obstructive sleep apnea (adult) (pediatric): Secondary | ICD-10-CM | POA: Diagnosis not present

## 2018-06-08 DIAGNOSIS — E559 Vitamin D deficiency, unspecified: Secondary | ICD-10-CM | POA: Diagnosis not present

## 2018-06-08 DIAGNOSIS — Z23 Encounter for immunization: Secondary | ICD-10-CM | POA: Diagnosis not present

## 2018-06-08 DIAGNOSIS — F17200 Nicotine dependence, unspecified, uncomplicated: Secondary | ICD-10-CM | POA: Diagnosis not present

## 2018-06-08 DIAGNOSIS — K219 Gastro-esophageal reflux disease without esophagitis: Secondary | ICD-10-CM | POA: Diagnosis not present

## 2018-06-08 DIAGNOSIS — Z0000 Encounter for general adult medical examination without abnormal findings: Secondary | ICD-10-CM | POA: Diagnosis not present

## 2018-06-08 DIAGNOSIS — G479 Sleep disorder, unspecified: Secondary | ICD-10-CM | POA: Diagnosis not present

## 2018-07-28 ENCOUNTER — Encounter: Payer: Self-pay | Admitting: Internal Medicine

## 2018-08-05 DIAGNOSIS — R05 Cough: Secondary | ICD-10-CM | POA: Diagnosis not present

## 2018-08-05 DIAGNOSIS — J069 Acute upper respiratory infection, unspecified: Secondary | ICD-10-CM | POA: Diagnosis not present

## 2018-08-05 DIAGNOSIS — J018 Other acute sinusitis: Secondary | ICD-10-CM | POA: Diagnosis not present

## 2018-09-01 DIAGNOSIS — E559 Vitamin D deficiency, unspecified: Secondary | ICD-10-CM | POA: Diagnosis not present

## 2018-09-01 DIAGNOSIS — E782 Mixed hyperlipidemia: Secondary | ICD-10-CM | POA: Diagnosis not present

## 2018-09-01 DIAGNOSIS — F17200 Nicotine dependence, unspecified, uncomplicated: Secondary | ICD-10-CM | POA: Diagnosis not present

## 2018-09-01 DIAGNOSIS — G894 Chronic pain syndrome: Secondary | ICD-10-CM | POA: Diagnosis not present

## 2018-09-01 DIAGNOSIS — M25571 Pain in right ankle and joints of right foot: Secondary | ICD-10-CM | POA: Diagnosis not present

## 2018-09-01 DIAGNOSIS — G5791 Unspecified mononeuropathy of right lower limb: Secondary | ICD-10-CM | POA: Diagnosis not present

## 2018-09-01 DIAGNOSIS — F1190 Opioid use, unspecified, uncomplicated: Secondary | ICD-10-CM | POA: Diagnosis not present

## 2018-09-01 DIAGNOSIS — G4700 Insomnia, unspecified: Secondary | ICD-10-CM | POA: Diagnosis not present

## 2018-09-06 ENCOUNTER — Ambulatory Visit (AMBULATORY_SURGERY_CENTER): Payer: Self-pay

## 2018-09-06 VITALS — Ht 60.0 in | Wt 126.4 lb

## 2018-09-06 DIAGNOSIS — Z86010 Personal history of colonic polyps: Secondary | ICD-10-CM

## 2018-09-06 MED ORDER — NA SULFATE-K SULFATE-MG SULF 17.5-3.13-1.6 GM/177ML PO SOLN: 1.0000 | Freq: Once | ORAL | 0 refills | Status: AC

## 2018-09-06 NOTE — Progress Notes (Signed)
Denies allergies to eggs or soy products. Denies complication of anesthesia or sedation. Denies use of weight loss medication. Denies use of O2.   Emmi instructions declined.  

## 2018-09-07 ENCOUNTER — Encounter: Payer: Self-pay | Admitting: Internal Medicine

## 2018-09-19 ENCOUNTER — Ambulatory Visit (AMBULATORY_SURGERY_CENTER): Payer: Medicare HMO | Admitting: Internal Medicine

## 2018-09-19 ENCOUNTER — Encounter: Payer: Self-pay | Admitting: Internal Medicine

## 2018-09-19 VITALS — BP 118/62 | HR 80 | Temp 97.1°F | Resp 11

## 2018-09-19 DIAGNOSIS — G4733 Obstructive sleep apnea (adult) (pediatric): Secondary | ICD-10-CM | POA: Diagnosis not present

## 2018-09-19 DIAGNOSIS — Z86010 Personal history of colonic polyps: Secondary | ICD-10-CM | POA: Diagnosis not present

## 2018-09-19 DIAGNOSIS — D124 Benign neoplasm of descending colon: Secondary | ICD-10-CM

## 2018-09-19 DIAGNOSIS — D123 Benign neoplasm of transverse colon: Secondary | ICD-10-CM

## 2018-09-19 DIAGNOSIS — F419 Anxiety disorder, unspecified: Secondary | ICD-10-CM | POA: Diagnosis not present

## 2018-09-19 DIAGNOSIS — K219 Gastro-esophageal reflux disease without esophagitis: Secondary | ICD-10-CM | POA: Diagnosis not present

## 2018-09-19 MED ORDER — SODIUM CHLORIDE 0.9 % IV SOLN: 500.0000 mL | INTRAVENOUS | Status: DC

## 2018-09-19 NOTE — Progress Notes (Signed)
Pt's states no medical or surgical changes since previsit or office visit. 

## 2018-09-19 NOTE — Progress Notes (Signed)
Report to PACU, RN, vss, BBS= Clear.  

## 2018-09-19 NOTE — Progress Notes (Signed)
Called to room to assist during endoscopic procedure.  Patient ID and intended procedure confirmed with present staff. Received instructions for my participation in the procedure from the performing physician.  

## 2018-09-19 NOTE — Patient Instructions (Signed)
Thank you for allowing [place] to care for you today!  Await pathology results by mail, approximately 2 weeks.  Next surveillance colonoscopy to be determined after pathology results final.  Resume previous diet and medications today.  Return to normal activities tomorrow.   YOU HAD AN ENDOSCOPIC PROCEDURE TODAY AT [facility]:   Refer to the procedure report that was given to you for any specific questions about what was found during the examination.  If the procedure report does not answer your questions, please call your gastroenterologist to clarify.  If you requested that your care partner not be given the details of your procedure findings, then the procedure report has been included in a sealed envelope for you to review at your convenience later.  YOU SHOULD EXPECT: Some feelings of bloating in the abdomen. Passage of more gas than usual.  Walking can help get rid of the air that was put into your GI tract during the procedure and reduce the bloating. If you had a lower endoscopy (such as a colonoscopy or flexible sigmoidoscopy) you may notice spotting of blood in your stool or on the toilet paper. If you underwent a bowel prep for your procedure, you may not have a normal bowel movement for a few days.  Please Note:  You might notice some irritation and congestion in your nose or some drainage.  This is from the oxygen used during your procedure.  There is no need for concern and it should clear up in a day or so.  SYMPTOMS TO REPORT IMMEDIATELY:   Following lower endoscopy (colonoscopy or flexible sigmoidoscopy):  Excessive amounts of blood in the stool  Significant tenderness or worsening of abdominal pains  Swelling of the abdomen that is new, acute  Fever of 100F or higher    For urgent or emergent issues, a gastroenterologist can be reached at any hour by calling [phone].   DIET:  We do recommend a small meal at first, but then you may proceed to your  regular diet.  Drink plenty of fluids but you should avoid alcoholic beverages for 24 hours.  ACTIVITY:  You should plan to take it easy for the rest of today and you should NOT DRIVE or use heavy machinery until tomorrow (because of the sedation medicines used during the test).    FOLLOW UP: Our staff will call the number listed on your records the next business day following your procedure to check on you and address any questions or concerns that you may have regarding the information given to you following your procedure. If we do not reach you, we will leave a message.  However, if you are feeling well and you are not experiencing any problems, there is no need to return our call.  We will assume that you have returned to your regular daily activities without incident.  If any biopsies were taken you will be contacted by phone or by letter within the next 1-3 weeks.  Please call us at [phone] if you have not heard about the biopsies in 3 weeks.    SIGNATURES/CONFIDENTIALITY: You and/or your care partner have signed paperwork which will be entered into your electronic medical record.  These signatures attest to the fact that that the information above on your After Visit Summary has been reviewed and is understood.  Full responsibility of the confidentiality of this discharge information lies with you and/or your care-partner.

## 2018-09-19 NOTE — Op Note (Signed)
[facility] Patient Name: Rebekah Chapman Procedure Date: 09/19/2018 8:27 AM MRN: [id number] Endoscopist: Jerene Bears , MD Age: 67 Referring MD:  Date of Birth: 11-Aug-1951 Gender: Female Account #: [id number] Procedure:                Colonoscopy Indications:              Surveillance: Personal history of adenomatous and                            sessile serrated polyps on last colonoscopy 3 years                            ago Medicines:                Monitored Anesthesia Care Procedure:                Pre-Anesthesia Assessment:                           - Prior to the procedure, a History and Physical                            was performed, and patient medications and                            allergies were reviewed. The patient's tolerance of                            previous anesthesia was also reviewed. The risks                            and benefits of the procedure and the sedation                            options and risks were discussed with the patient.                            All questions were answered, and informed consent                            was obtained. Prior Anticoagulants: The patient has                            taken no previous anticoagulant or antiplatelet                            agents. ASA Grade Assessment: II - A patient with                            mild systemic disease. After reviewing the risks                            and benefits, the patient was deemed in  satisfactory condition to undergo the procedure.                           After obtaining informed consent, the colonoscope                            was passed under direct vision. Throughout the                            procedure, the patient's blood pressure, pulse, and                            oxygen saturations were monitored continuously. The                            Colonoscope was introduced through the anus and                         advanced to the cecum, identified by appendiceal                            orifice and ileocecal valve. The colonoscopy was                            performed without difficulty. The patient tolerated                            the procedure well. The quality of the bowel                            preparation was good. The ileocecal valve,                            appendiceal orifice, and rectum were photographed. Scope In: 8:32:20 AM Scope Out: 8:50:13 AM Scope Withdrawal Time: 0 hours 13 minutes 27 seconds  Total Procedure Duration: 0 hours 17 minutes 53 seconds  Findings:                 The digital rectal exam was normal.                           Two sessile polyps were found in the descending                            colon and hepatic flexure. The polyps were 3 to 6                            mm in size. These polyps were removed with a cold                            snare. Resection and retrieval were complete.                           Scattered small-mouthed diverticula were found in  the sigmoid colon, descending colon, transverse                            colon and ascending colon.                           Internal hemorrhoids were found during                            retroflexion. The hemorrhoids were small. Complications:            No immediate complications. Estimated Blood Loss:     Estimated blood loss was minimal. Impression:               - Two 3 to 6 mm polyps in the descending colon and                            at the hepatic flexure, removed with a cold snare.                            Resected and retrieved.                           - Diverticulosis in the sigmoid colon, in the                            descending colon, in the transverse colon and in                            the ascending colon.                           - Internal hemorrhoids. Recommendation:           - Patient has a contact  number available for                            emergencies. The signs and symptoms of potential                            delayed complications were discussed with the                            patient. Return to normal activities tomorrow.                            Written discharge instructions were provided to the                            patient.                           - Resume previous diet.                           - Continue present medications.                           -  Await pathology results.                           - Repeat colonoscopy is recommended for                            surveillance. The colonoscopy date will be                            determined after pathology results from today's                            exam become available for review. Jerene Bears, MD 09/19/2018 8:57:39 AM This report has been signed electronically.

## 2018-09-20 ENCOUNTER — Telehealth: Payer: Self-pay | Admitting: *Deleted

## 2018-09-20 ENCOUNTER — Telehealth: Payer: Self-pay

## 2018-09-20 NOTE — Telephone Encounter (Signed)
Second follow up call attempt.  Reached answering machine with no identifiers.  No message left.

## 2018-09-20 NOTE — Telephone Encounter (Signed)
First post procedure follow up call, no answer 

## 2018-09-27 ENCOUNTER — Encounter: Payer: Self-pay | Admitting: Internal Medicine

## 2018-10-02 DIAGNOSIS — G4733 Obstructive sleep apnea (adult) (pediatric): Secondary | ICD-10-CM | POA: Diagnosis not present

## 2018-10-19 DIAGNOSIS — H5203 Hypermetropia, bilateral: Secondary | ICD-10-CM | POA: Diagnosis not present

## 2018-10-19 DIAGNOSIS — H52209 Unspecified astigmatism, unspecified eye: Secondary | ICD-10-CM | POA: Diagnosis not present

## 2018-10-19 DIAGNOSIS — H524 Presbyopia: Secondary | ICD-10-CM | POA: Diagnosis not present

## 2018-11-23 DIAGNOSIS — J22 Unspecified acute lower respiratory infection: Secondary | ICD-10-CM | POA: Diagnosis not present

## 2018-11-30 DIAGNOSIS — G894 Chronic pain syndrome: Secondary | ICD-10-CM | POA: Diagnosis not present

## 2018-11-30 DIAGNOSIS — Z720 Tobacco use: Secondary | ICD-10-CM | POA: Diagnosis not present

## 2018-11-30 DIAGNOSIS — G4709 Other insomnia: Secondary | ICD-10-CM | POA: Diagnosis not present

## 2018-12-27 DIAGNOSIS — G4733 Obstructive sleep apnea (adult) (pediatric): Secondary | ICD-10-CM | POA: Diagnosis not present

## 2018-12-27 DIAGNOSIS — R911 Solitary pulmonary nodule: Secondary | ICD-10-CM | POA: Diagnosis not present

## 2018-12-27 DIAGNOSIS — F17210 Nicotine dependence, cigarettes, uncomplicated: Secondary | ICD-10-CM | POA: Diagnosis not present

## 2019-01-02 DIAGNOSIS — G4733 Obstructive sleep apnea (adult) (pediatric): Secondary | ICD-10-CM | POA: Diagnosis not present

## 2019-01-15 ENCOUNTER — Other Ambulatory Visit: Payer: Self-pay

## 2019-01-15 ENCOUNTER — Observation Stay (HOSPITAL_COMMUNITY)
Admission: EM | Admit: 2019-01-15 | Discharge: 2019-01-16 | Disposition: A | Discharge status: Home / Self Care | Payer: Medicare HMO | Attending: Internal Medicine | Admitting: Internal Medicine

## 2019-01-15 ENCOUNTER — Emergency Department (HOSPITAL_COMMUNITY): Payer: Medicare HMO

## 2019-01-15 ENCOUNTER — Encounter (HOSPITAL_COMMUNITY): Payer: Self-pay

## 2019-01-15 DIAGNOSIS — F13231 Sedative, hypnotic or anxiolytic dependence with withdrawal delirium: Secondary | ICD-10-CM | POA: Diagnosis present

## 2019-01-15 DIAGNOSIS — F17210 Nicotine dependence, cigarettes, uncomplicated: Secondary | ICD-10-CM | POA: Insufficient documentation

## 2019-01-15 DIAGNOSIS — F329 Major depressive disorder, single episode, unspecified: Secondary | ICD-10-CM | POA: Diagnosis not present

## 2019-01-15 DIAGNOSIS — F13931 Sedative, hypnotic or anxiolytic use, unspecified with withdrawal delirium: Secondary | ICD-10-CM | POA: Diagnosis present

## 2019-01-15 DIAGNOSIS — M1990 Unspecified osteoarthritis, unspecified site: Secondary | ICD-10-CM | POA: Insufficient documentation

## 2019-01-15 DIAGNOSIS — M810 Age-related osteoporosis without current pathological fracture: Secondary | ICD-10-CM | POA: Insufficient documentation

## 2019-01-15 DIAGNOSIS — F419 Anxiety disorder, unspecified: Secondary | ICD-10-CM | POA: Diagnosis not present

## 2019-01-15 DIAGNOSIS — F13239 Sedative, hypnotic or anxiolytic dependence with withdrawal, unspecified: Secondary | ICD-10-CM | POA: Diagnosis not present

## 2019-01-15 DIAGNOSIS — R000 Tachycardia, unspecified: Secondary | ICD-10-CM | POA: Diagnosis not present

## 2019-01-15 DIAGNOSIS — R4182 Altered mental status, unspecified: Secondary | ICD-10-CM | POA: Diagnosis not present

## 2019-01-15 DIAGNOSIS — R443 Hallucinations, unspecified: Secondary | ICD-10-CM | POA: Diagnosis not present

## 2019-01-15 DIAGNOSIS — I252 Old myocardial infarction: Secondary | ICD-10-CM | POA: Insufficient documentation

## 2019-01-15 DIAGNOSIS — Z79899 Other long term (current) drug therapy: Secondary | ICD-10-CM | POA: Diagnosis not present

## 2019-01-15 DIAGNOSIS — J439 Emphysema, unspecified: Secondary | ICD-10-CM | POA: Diagnosis not present

## 2019-01-15 DIAGNOSIS — G4733 Obstructive sleep apnea (adult) (pediatric): Secondary | ICD-10-CM | POA: Insufficient documentation

## 2019-01-15 LAB — CBC WITH DIFFERENTIAL/PLATELET
Abs Immature Granulocytes: 0.03 10*3/uL (ref 0.00–0.07)
Basophils Absolute: 0 10*3/uL (ref 0.0–0.1)
Basophils Relative: 0 %
Eosinophils Absolute: 0 10*3/uL (ref 0.0–0.5)
Eosinophils Relative: 0 %
HCT: 41.1 % (ref 36.0–46.0)
Hemoglobin: 13.5 g/dL (ref 12.0–15.0)
Immature Granulocytes: 0 %
Lymphocytes Relative: 15 %
Lymphs Abs: 1.5 10*3/uL (ref 0.7–4.0)
MCH: 32.7 pg (ref 26.0–34.0)
MCHC: 32.8 g/dL (ref 30.0–36.0)
MCV: 99.5 fL (ref 80.0–100.0)
MONOS PCT: 8 %
Monocytes Absolute: 0.8 10*3/uL (ref 0.1–1.0)
NEUTROS PCT: 77 %
Neutro Abs: 8 10*3/uL — ABNORMAL HIGH (ref 1.7–7.7)
Platelets: 256 10*3/uL (ref 150–400)
RBC: 4.13 MIL/uL (ref 3.87–5.11)
RDW: 13.1 % (ref 11.5–15.5)
WBC: 10.4 10*3/uL (ref 4.0–10.5)
nRBC: 0 % (ref 0.0–0.2)

## 2019-01-15 LAB — POCT I-STAT EG7
Acid-Base Excess: 5 mmol/L — ABNORMAL HIGH (ref 0.0–2.0)
Bicarbonate: 27.8 mmol/L (ref 20.0–28.0)
Calcium, Ion: 1.13 mmol/L — ABNORMAL LOW (ref 1.15–1.40)
HEMATOCRIT: 39 % (ref 36.0–46.0)
Hemoglobin: 13.3 g/dL (ref 12.0–15.0)
O2 Saturation: 65 %
Patient temperature: 98.7
Potassium: 3.7 mmol/L (ref 3.5–5.1)
Sodium: 138 mmol/L (ref 135–145)
TCO2: 29 mmol/L (ref 22–32)
pCO2, Ven: 34.8 mmHg — ABNORMAL LOW (ref 44.0–60.0)
pH, Ven: 7.511 — ABNORMAL HIGH (ref 7.250–7.430)
pO2, Ven: 30 mmHg — CL (ref 32.0–45.0)

## 2019-01-15 LAB — COMPREHENSIVE METABOLIC PANEL
ALBUMIN: 3.9 g/dL (ref 3.5–5.0)
ALT: 7 U/L (ref 0–44)
AST: 17 U/L (ref 15–41)
Alkaline Phosphatase: 71 U/L (ref 38–126)
Anion gap: 12 (ref 5–15)
BUN: 7 mg/dL — AB (ref 8–23)
CO2: 25 mmol/L (ref 22–32)
Calcium: 10.1 mg/dL (ref 8.9–10.3)
Chloride: 101 mmol/L (ref 98–111)
Creatinine, Ser: 0.81 mg/dL (ref 0.44–1.00)
GFR calc Af Amer: >60 mL/min (ref >60)
GFR calc non Af Amer: >60 mL/min (ref >60)
Glucose, Bld: 124 mg/dL — ABNORMAL HIGH (ref 70–99)
Potassium: 3.3 mmol/L — ABNORMAL LOW (ref 3.5–5.1)
Sodium: 138 mmol/L (ref 135–145)
Total Bilirubin: 0.8 mg/dL (ref 0.3–1.2)
Total Protein: 7.6 g/dL (ref 6.5–8.1)

## 2019-01-15 LAB — URINALYSIS, COMPLETE (UACMP) WITH MICROSCOPIC
Bacteria, UA: NONE SEEN
Bilirubin Urine: NEGATIVE
GLUCOSE, UA: NEGATIVE mg/dL
Ketones, ur: NEGATIVE mg/dL
LEUKOCYTE UA: NEGATIVE
Nitrite: NEGATIVE
Protein, ur: NEGATIVE mg/dL
Specific Gravity, Urine: 1.008 (ref 1.005–1.030)
pH: 7 (ref 5.0–8.0)

## 2019-01-15 LAB — RAPID URINE DRUG SCREEN, HOSP PERFORMED
Amphetamines: NOT DETECTED
BARBITURATES: NOT DETECTED
BENZODIAZEPINES: NOT DETECTED
Cocaine: NOT DETECTED
Opiates: POSITIVE — AB
Tetrahydrocannabinol: NOT DETECTED

## 2019-01-15 LAB — LACTIC ACID, PLASMA: Lactic Acid, Venous: 1.1 mmol/L (ref 0.5–1.9)

## 2019-01-15 LAB — CBG MONITORING, ED: GLUCOSE-CAPILLARY: 119 mg/dL — AB (ref 70–99)

## 2019-01-15 LAB — AMMONIA: Ammonia: 24 umol/L (ref 9–35)

## 2019-01-15 LAB — ETHANOL: Alcohol, Ethyl (B): <10 mg/dL (ref <10)

## 2019-01-15 MED ORDER — VITAMIN B-1 100 MG PO TABS: 100.0000 mg | ORAL_TABLET | Freq: Every day | ORAL | Status: DC

## 2019-01-15 MED ORDER — ACETAMINOPHEN 325 MG PO TABS
650.0000 mg | ORAL_TABLET | Freq: Once | ORAL | Status: AC
  Administered 2019-01-15: 650 mg via ORAL
  Filled 2019-01-15: qty 2

## 2019-01-15 MED ORDER — LORAZEPAM 1 MG PO TABS: 0.0000 mg | ORAL_TABLET | Freq: Four times a day (QID) | ORAL | Status: DC

## 2019-01-15 MED ORDER — LORAZEPAM 2 MG/ML IJ SOLN
0.0000 mg | Freq: Four times a day (QID) | INTRAMUSCULAR | Status: DC
  Administered 2019-01-15: 1 mg via INTRAVENOUS
  Filled 2019-01-15: qty 1

## 2019-01-15 MED ORDER — THIAMINE HCL 100 MG/ML IJ SOLN: 100.0000 mg | Freq: Every day | INTRAMUSCULAR | Status: DC

## 2019-01-15 MED ORDER — LORAZEPAM 2 MG/ML IJ SOLN: 0.0000 mg | Freq: Two times a day (BID) | INTRAMUSCULAR | Status: DC

## 2019-01-15 MED ORDER — LORAZEPAM 2 MG/ML IJ SOLN
1.0000 mg | INTRAMUSCULAR | Status: DC
  Administered 2019-01-15 – 2019-01-16 (×5): 1 mg via INTRAVENOUS
  Filled 2019-01-15 (×5): qty 1

## 2019-01-15 MED ORDER — LORAZEPAM 1 MG PO TABS: 0.0000 mg | ORAL_TABLET | Freq: Two times a day (BID) | ORAL | Status: DC

## 2019-01-15 MED ORDER — ACETAMINOPHEN 325 MG PO TABS
650.0000 mg | ORAL_TABLET | Freq: Four times a day (QID) | ORAL | Status: DC | PRN
  Administered 2019-01-16: 650 mg via ORAL
  Filled 2019-01-15: qty 2

## 2019-01-15 MED ORDER — LORAZEPAM 2 MG/ML IJ SOLN: 0.5000 mg | INTRAMUSCULAR | Status: DC | PRN

## 2019-01-15 MED ORDER — ONDANSETRON HCL 4 MG PO TABS: 4.0000 mg | ORAL_TABLET | Freq: Four times a day (QID) | ORAL | Status: DC | PRN

## 2019-01-15 MED ORDER — ENOXAPARIN SODIUM 40 MG/0.4ML ~~LOC~~ SOLN
40.0000 mg | SUBCUTANEOUS | Status: DC
  Filled 2019-01-15 (×2): qty 0.4

## 2019-01-15 MED ORDER — ACETAMINOPHEN 650 MG RE SUPP: 650.0000 mg | Freq: Four times a day (QID) | RECTAL | Status: DC | PRN

## 2019-01-15 MED ORDER — DOCUSATE SODIUM 100 MG PO CAPS
100.0000 mg | ORAL_CAPSULE | Freq: Two times a day (BID) | ORAL | Status: DC
  Administered 2019-01-15: 100 mg via ORAL
  Filled 2019-01-15 (×2): qty 1

## 2019-01-15 MED ORDER — LORAZEPAM 1 MG PO TABS
1.0000 mg | ORAL_TABLET | Freq: Once | ORAL | Status: AC
  Administered 2019-01-15: 1 mg via ORAL
  Filled 2019-01-15: qty 1

## 2019-01-15 MED ORDER — ONDANSETRON HCL 4 MG/2ML IJ SOLN: 4.0000 mg | Freq: Four times a day (QID) | INTRAMUSCULAR | Status: DC | PRN

## 2019-01-15 NOTE — ED Notes (Signed)
Pt daughter at bedside. Pt confused, states she is getting married today. Pt speaking words that are not words.

## 2019-01-15 NOTE — ED Triage Notes (Signed)
Patient BIB family for altered mental status. Per family patient was taken off Xanax and started on a new medication 3 days, since then she has started "seeing and hearing things that weren't there, just not her normal behavior". Family reports decreased sleep over the last 2 days but tonight has become lethargic. Denies nausea, vomiting, fever, or any other symptoms at this time.

## 2019-01-15 NOTE — ED Provider Notes (Signed)
Emergency Department Provider Note   I have reviewed the triage vital signs and the nursing notes.   HISTORY  Chief Complaint No chief complaint on file.   HPI Rebekah Chapman is a 68 y.o. female without significant past medical history the presents the emergency department today secondary to delusions and hallucinations.  Patient states that she sees people that are not there and thinks that her husband was shot yesterday and she believes these things however she is oriented.  She also states that someone walked in the bathroom when her daughters are with her state that never happened.  This states this is started since she started a medication called Belsomra that is for sleep after being on Xanax for multiple years.  She has no infectious symptoms.  No fever.  No history of Alzheimer's.  No other associated symptoms. No other associated or modifying symptoms.    Past Medical History:  Diagnosis Date  . Anxiety   . Arthritis   . Osteoporosis   . Sleep apnea 2006   bipap used 2 yrs ago    There are no active problems to display for this patient.   Past Surgical History:  Procedure Laterality Date  . ABDOMINAL HYSTERECTOMY    . ANKLE SURGERY  2014   right side w pins  . COLONOSCOPY  2011  . [place]   left side  . POLYPECTOMY        Allergies Nsaids  Family History  Problem Relation Age of Onset  . Colon cancer Mother 86  . Esophageal cancer Neg Hx   . Rectal cancer Neg Hx   . Stomach cancer Neg Hx     Social History Social History   Tobacco Use  . Smoking status: Current Every Day Smoker    Packs/day: 1.00    Types: Cigarettes  . Smokeless tobacco: Never Used  Substance Use Topics  . Alcohol use: No    Alcohol/week: 0.0 standard drinks  . Drug use: No    Review of Systems  All other systems negative except as documented in the HPI. All pertinent positives and negatives as reviewed in the HPI.  ____________________________________________   PHYSICAL EXAM:  VITAL SIGNS: ED Triage Vitals  Enc Vitals Group     BP 01/15/19 0024 (!) 172/108     Pulse Rate 01/15/19 0024 86     Resp 01/15/19 0024 12     Temp 01/15/19 0139 98.7 F (37.1 C)     Temp Source 01/15/19 0139 Rectal     SpO2 01/15/19 0024 99 %     Weight 01/15/19 0032 125 lb (56.7 kg)     Height 01/15/19 0032 5\' 1"  (1.549 m)    Constitutional: Alert and oriented. Well appearing and in no acute distress. Eyes: Conjunctivae are normal. PERRL. EOMI. Head: Atraumatic. Nose: No congestion/rhinnorhea. Mouth/Throat: Mucous membranes are moist.  Oropharynx non-erythematous. Neck: No stridor.  No meningeal signs.   Cardiovascular: Normal rate, regular rhythm. Good peripheral circulation. Grossly normal heart sounds.   Respiratory: Normal respiratory effort.  No retractions. Lungs CTAB. Gastrointestinal: Soft and nontender. No distention.  Musculoskeletal: No lower extremity tenderness nor edema. No gross deformities of extremities. Neurologic:  Normal speech and language. No gross focal neurologic deficits are appreciated.  Skin:  Skin is warm, dry and intact. No rash noted.   ____________________________________________   LABS (all labs ordered are listed, but only abnormal results are displayed)  Labs Reviewed  COMPREHENSIVE METABOLIC PANEL - Abnormal;  Notable for the following components:      Result Value   Potassium 3.3 (*)    Glucose, Bld 124 (*)    BUN 7 (*)    All other components within normal limits  RAPID URINE DRUG SCREEN, HOSP PERFORMED - Abnormal; Notable for the following components:   Opiates POSITIVE (*)    All other components within normal limits  CBC WITH DIFFERENTIAL/PLATELET - Abnormal; Notable for the following components:   Neutro Abs 8.0 (*)    All other components within normal limits  URINALYSIS, COMPLETE (UACMP) WITH MICROSCOPIC - Abnormal; Notable for the following components:   Hgb  urine dipstick SMALL (*)    All other components within normal limits  POCT I-STAT EG7 - Abnormal; Notable for the following components:   pH, Ven 7.511 (*)    pCO2, Ven 34.8 (*)    pO2, Ven 30.0 (*)    Acid-Base Excess 5.0 (*)    Calcium, Ion 1.13 (*)    All other components within normal limits  CULTURE, BLOOD (ROUTINE X 2)  CULTURE, BLOOD (ROUTINE X 2)  URINE CULTURE  AMMONIA  LACTIC ACID, PLASMA  ETHANOL  I-STAT VENOUS BLOOD GAS, ED  CBG MONITORING, ED   ____________________________________________  EKG   EKG Interpretation  Date/Time:  Sunday January 15 2019 01:40:18 EDT Ventricular Rate:  84 PR Interval:    QRS Duration: 81 QT Interval:  346 QTC Calculation: 409 R Axis:   88 Text Interpretation:  Sinus tachycardia Ventricular trigeminy Borderline short PR interval Consider left atrial enlargement Anterior infarct, old No old tracing to compare Confirmed by Merrily Pew [phone]) on 01/15/2019 2:18:32 AM       ____________________________________________  RADIOLOGY  Dg Chest 2 View  Result Date: 01/15/2019 CLINICAL DATA:  Patient started a new medication 3 days ago and since then has been seeing and hearing things that are not there. Decreased sleep and lethargy. Altered mental status. EXAM: CHEST - 2 VIEW COMPARISON:  08/03/2012 FINDINGS: Normal heart size and pulmonary vascularity. Bullous emphysematous changes in the upper lungs. Slight interstitial fibrosis, likely chronic bronchitis. No focal consolidation. No blunting of costophrenic angles. No pneumothorax. Mediastinal contours appear intact. Calcification of the aorta. Degenerative changes in the spine. IMPRESSION: Emphysematous and chronic bronchitic changes in the lungs. No evidence of active pulmonary disease. Electronically Signed   By: Lucienne Capers M.D.   On: 01/15/2019 02:02   Ct Head Wo Contrast  Result Date: 01/15/2019 CLINICAL DATA:  Altered mental status. EXAM: CT HEAD WITHOUT CONTRAST TECHNIQUE:  Contiguous axial images were obtained from the base of the skull through the vertex without intravenous contrast. COMPARISON:  None. FINDINGS: Brain: No evidence of acute infarction, hemorrhage, hydrocephalus, extra-axial collection or mass lesion/mass effect. Mild cerebral atrophy. Vascular: No hyperdense vessel or unexpected calcification. Skull: Calvarium appears intact. Sinuses/Orbits: Paranasal sinuses and mastoid air cells are clear. Other: None. IMPRESSION: No acute intracranial abnormalities. Electronically Signed   By: Lucienne Capers M.D.   On: 01/15/2019 02:30    ____________________________________________   PROCEDURES  Procedure(s) performed:   Procedures   ____________________________________________   INITIAL IMPRESSION / ASSESSMENT AND PLAN / ED COURSE  Patient is alert and oriented to place and time.  She believes some of her delusions but then she recognizes that some of the hallucinations are not real.  She also is a bit fidgety on exam consistent with possible delirium.  She is had decreased sleep recently could be related to that however that is why  likely related to the [place].  The patient's work-up was negative for any any infectious or neurologic causes for her symptoms electrolytes are all within normal limits.  I discussed off the record with medicine and they would want a psychiatry consult prior to medicine evaluation.  I discussed with TTS and they will see the patient and put in recommendations as far as emergency room observation versus medicine admission and observation. If daughters decide to take her home or TTS recommends same I don't think she is a harm for herself.   Will give one dose of ativan to see if it helps and possibly related to Benzo withdrawal.   Pertinent labs & imaging results that were available during my care of the patient were reviewed by me and considered in my medical decision making (see chart for details).   ____________________________________________  FINAL CLINICAL IMPRESSION(S) / ED DIAGNOSES  Final diagnoses:  None     MEDICATIONS GIVEN DURING THIS VISIT:  Medications  acetaminophen (TYLENOL) tablet 650 mg (650 mg Oral Given 01/15/19 0343)     NEW OUTPATIENT MEDICATIONS STARTED DURING THIS VISIT:  New Prescriptions   No medications on file    Note:  This note was prepared with assistance of Dragon voice recognition software. Occasional wrong-word or sound-a-like substitutions may have occurred due to the inherent limitations of voice recognition software.   Lorrie Gargan, Corene Cornea, MD 01/15/19 [phone]

## 2019-01-15 NOTE — ED Notes (Signed)
Patient transported to X-ray 

## 2019-01-15 NOTE — Progress Notes (Signed)
Patient is seen by me via tele-psych and I have consulted with Rebekah Chapman.  Patient makes bizarre comments and is at first rambling about a big fish at [facility].  Then the patient is making random comments that have no relation to the conversation.  She was able to tell me her name and her date of birth.  [name] is in the room.  The patient is unable to tell me the events that have happened.  The daughter does report that the patient has not been making any suicidal comments or suicidal gestures and has not been making any homicidal threats or gestures, but has been a little agitated due to her confusion.  They state that the patient was on Xanax for 29 years and on Monday she was discontinued from it and was started on Belsomra.  Patient's daughter reports that there was no tapering orders given.  Patient seems to be going through some severe Xanax withdrawal symptoms.  There is no history of mental health and no history of any other psychotropic drugs.  Patient does not meet inpatient criteria for psychiatric treatment.  Patient is psychiatrically cleared.  I contacted Rebekah Chapman and notified him of the plan and requested for patient to be medically admitted for medical detox and they were in agreement to try to get the patient admitted.

## 2019-01-15 NOTE — ED Notes (Signed)
Patient to get Ativan for anxiety and will be reassessed by psychiatry in the morning.

## 2019-01-15 NOTE — BH Assessment (Signed)
Tele Assessment Note   Patient Name: Rebekah Chapman MRN: [id number] Referring Physician: Merrily Pew, MD Location of Patient: [facility], [phone] Location of Provider: [place] is an 68 y.o. separated female who presents to [facility] accompanied by her daughters, Kyla Balzarine and Jodelle Gross, who participated in assessment with Pt's consent. Pt initially was very tearful and distraught. She says she and her husband have separated after 29 years together. She and her daughters report Pt saw a new physician who took Pt off Xanax on 01/09/19 after she had been taking them for years. Pt says she was not instructed to taper off Xanax. Pt reports she was started on a medication for sleep. Ms Jeanell Sparrow says Pt began hallucinating three days ago, stating that people were trying to break into the house, that people were drilling in the floor, and seeing people who are not there. Pt also believed her husband was shot, which is not true. Pt acknowledges she has felt confused and seeing things. She says she has not slept well and her appetite has decreased. She acknowledges she has experienced suicidal ideation in the past but not currently. She denies any history of suicide attempts. Protective factors against suicide include good family support, future orientation, therapeutic relationship, no access to firearms, religious convictions and no prior attempts. Pt denies thoughts of harming others. Ms Jeanell Sparrow reports Pt has been aggressive recently, which is atypical behavior. Ms Jeanell Sparrow also reports Pt has been sleeping with a screwdriver. Pt denies alcohol or other substance use. Pt's daughter say Pt has never behaved this way before.  Pt is currently living with her sister, Judson Roch. She identifies several family members as supports. Pt denies any history of inpatient psychiatric treatment.  Pt is dressed in hospital gown, alert and oriented x4. Pt speaks in a clear tone, at  moderate volume and normal pace. Motor behavior appears normal. Eye contact is good and Pt is tearful. Pt's mood is anxious and affect is congruent with mood. Thought process is coherent at times, circumstantial at other times. Pt was cooperative throughout assessment. She says she is willing to sign voluntarily into a psychiatric facility.    Diagnosis: F28 Other specified psychotic disorder  Past Medical History:  Past Medical History:  Diagnosis Date  . Anxiety   . Arthritis   . Osteoporosis   . Sleep apnea 2006   bipap used 2 yrs ago    Past Surgical History:  Procedure Laterality Date  . ABDOMINAL HYSTERECTOMY    . ANKLE SURGERY  2014   right side w pins  . COLONOSCOPY  2011  . [place]   left side  . POLYPECTOMY      Family History:  Family History  Problem Relation Age of Onset  . Colon cancer Mother 95  . Esophageal cancer Neg Hx   . Rectal cancer Neg Hx   . Stomach cancer Neg Hx     Social History:  reports that she has been smoking cigarettes. She has been smoking about 1.00 pack per day. She has never used smokeless tobacco. She reports that she does not drink alcohol or use drugs.  Additional Social History:  Alcohol / Drug Use Pain Medications: Denies abuse Prescriptions: Denies abuse Over the Counter: Denies abuse History of alcohol / drug use?: No history of alcohol / drug abuse Longest period of sobriety (when/how long): NA  CIWA: CIWA-Ar BP: (!) 168/89 Pulse Rate: 81 COWS:  Allergies:  Allergies  Allergen Reactions  . Nsaids Nausea Only    Home Medications: (Not in a hospital admission)   OB/GYN Status:  No LMP recorded. Patient has had a hysterectomy.  General Assessment Data Location of Assessment: [facility] ED TTS Assessment: In system Is this a Tele or Face-to-Face Assessment?: Tele Assessment Is this an Initial Assessment or a Re-assessment for this encounter?: Initial Assessment Patient Accompanied by:: Other(2  daughters) Language Other than English: No Living Arrangements: Other (Comment)(Staying with sister) What gender do you identify as?: Female Marital status: Separated Maiden name: na Pregnancy Status: No Living Arrangements: Other relatives Can pt return to current living arrangement?: Yes Admission Status: Voluntary Is patient capable of signing voluntary admission?: Yes Referral Source: Self/Family/Friend Insurance type: [employer]     Crisis Care Plan Living Arrangements: Other relatives Legal Guardian: Other:(Self) Name of Psychiatrist: None Name of Therapist: None  Education Status Is patient currently in school?: No Is the patient employed, unemployed or receiving disability?: Unemployed  Risk to self with the past 6 months Suicidal Ideation: No Has patient been a risk to self within the past 6 months prior to admission? : No Suicidal Intent: No Has patient had any suicidal intent within the past 6 months prior to admission? : No Is patient at risk for suicide?: No Suicidal Plan?: No Has patient had any suicidal plan within the past 6 months prior to admission? : No Access to Means: No What has been your use of drugs/alcohol within the last 12 months?: Pt denies Previous Attempts/Gestures: No How many times?: 0 Other Self Harm Risks: None Triggers for Past Attempts: None known Intentional Self Injurious Behavior: None Family Suicide History: No Recent stressful life event(s): Divorce Persecutory voices/beliefs?: No Depression: Yes Depression Symptoms: Despondent, Insomnia, Tearfulness, Fatigue, Loss of interest in usual pleasures, Feeling angry/irritable Substance abuse history and/or treatment for substance abuse?: No Suicide prevention information given to non-admitted patients: Not applicable  Risk to Others within the past 6 months Homicidal Ideation: No Does patient have any lifetime risk of violence toward others beyond the six months prior to  admission? : No Thoughts of Harm to Others: No Current Homicidal Intent: No Current Homicidal Plan: No Access to Homicidal Means: No Identified Victim: None History of harm to others?: No Assessment of Violence: None Noted Violent Behavior Description: None Does patient have access to weapons?: No Criminal Charges Pending?: No Does patient have a court date: No Is patient on probation?: No  Psychosis Hallucinations: Auditory, Visual Delusions: Persecutory  Mental Status Report Appearance/Hygiene: In hospital gown Eye Contact: Good Motor Activity: Unremarkable Speech: Logical/coherent Level of Consciousness: Alert, Crying Mood: Anxious Affect: Anxious Anxiety Level: Severe Thought Processes: Coherent, Circumstantial Judgement: Impaired Orientation: Person, Place, Time, Situation Obsessive Compulsive Thoughts/Behaviors: None  Cognitive Functioning Concentration: Normal Memory: Remote Intact, Remote Impaired Is patient IDD: No Insight: Fair Impulse Control: Fair Appetite: Poor Have you had any weight changes? : No Change Sleep: Decreased Total Hours of Sleep: 3 Vegetative Symptoms: None  ADLScreening [facility] Assessment Services) Patient's cognitive ability adequate to safely complete daily activities?: Yes Patient able to express need for assistance with ADLs?: Yes Independently performs ADLs?: Yes (appropriate for developmental age)  Prior Inpatient Therapy Prior Inpatient Therapy: No  Prior Outpatient Therapy Prior Outpatient Therapy: No Does patient have an ACCT team?: No Does patient have Intensive In-House Services?  : No Does patient have [facility] services? : No Does patient have P4CC services?: No  ADL Screening (condition at time of  admission) Patient's cognitive ability adequate to safely complete daily activities?: Yes Is the patient deaf or have difficulty hearing?: No Does the patient have difficulty seeing, even when wearing glasses/contacts?:  No Does the patient have difficulty concentrating, remembering, or making decisions?: Yes Patient able to express need for assistance with ADLs?: Yes Does the patient have difficulty dressing or bathing?: No Independently performs ADLs?: Yes (appropriate for developmental age) Does the patient have difficulty walking or climbing stairs?: No Weakness of Legs: None Weakness of Arms/Hands: None  Home Assistive Devices/Equipment Home Assistive Devices/Equipment: BIPAP    Abuse/Neglect Assessment (Assessment to be complete while patient is alone) Abuse/Neglect Assessment Can Be Completed: Yes Physical Abuse: Denies Verbal Abuse: Denies Sexual Abuse: Denies Exploitation of patient/patient's resources: Denies Self-Neglect: Denies     Regulatory affairs officer (For Healthcare) Does Patient Have a Medical Advance Directive?: No Would patient like information on creating a medical advance directive?: No - Patient declined          Disposition: Gave clinical report to Lindon Romp, FNP who recommended Pt be observed and evaluated later this morning by psychiatry. Notified Dr. Merrily Pew and Josephine Igo, RN of recommendation.  Disposition Initial Assessment Completed for this Encounter: Yes Patient referred to: Other (Comment)  This service was provided via telemedicine using a 2-way, interactive audio and video technology.  Names of all persons participating in this telemedicine service and their role in this encounter. Name: Rebekah Chapman Role: Patient  Name: Kyla Balzarine Role: Pt's daughter  Name: Jodelle Gross Role: Pt's daughter  Name: Storm Frisk, [facility] Role: TTS counselor   Orpah Greek Anson Fret, [facility], [facility], [facility] Triage Specialist [phone]  Evelena Peat 01/15/2019 6:09 AM

## 2019-01-15 NOTE — Progress Notes (Signed)
CPAP set up at bedside.  Second attempt by RT to place CPAP. Patient declined at this time. Patient aware call for RT when ready.

## 2019-01-15 NOTE — ED Notes (Signed)
Dr Dayna Barker informed of I Stat7 results

## 2019-01-15 NOTE — H&P (Signed)
History and Physical    Rebekah Chapman DOB: Mar 04, 1951 DOA: 01/15/2019  PCP: Briscoe Deutscher, MD Consultants:  Hilarie Fredrickson - GIMichela Pitcher - sleep Patient coming from:  Home - staying with daughter, SIL, grandson, as she is going through a divorce; NOK: Daughter, [phone]  Chief Complaint:  AMS  HPI: Rebekah Chapman is a 68 y.o. female with medical history significant of OSA and anxiety presenting with hallucinations.  She went to her new doctor Monday and he changed her medication.  She had been taking Xanax for many years and was changed to [employer].  She started Thursday night/Friday morning with talking out of her head.  She was talking about people drilling holes in the floor.  She was having visual hallucinations, called 911 because she thought she saw people in the house.  She has been talking to her dead parents.  Family does not have concerns about baseline psychiatric illness or dementia.  She did not get fired from her job about 3 weeks ago; daughter reports that they said she didn't seem confident in her ability to do her job anymore.   ED Course:  Presented with delirium - on BZD for years and recently stopped.  Tried to admit to medicine, psych saw her.   They think this is BZD withdrawal and recommend admission.  Review of Systems: Unable to effectively perform   Ambulatory Status:  Ambulates without assistance  Past Medical History:  Diagnosis Date  . Anxiety   . Arthritis   . Osteoporosis   . Sleep apnea 2006   bipap used 2 yrs ago    Past Surgical History:  Procedure Laterality Date  . ABDOMINAL HYSTERECTOMY    . ANKLE SURGERY  2014   right side w pins  . COLONOSCOPY  2011  . [place]   left side  . POLYPECTOMY      Social History   Socioeconomic History  . Marital status: Married    Spouse name: Not on file  . Number of children: Not on file  . Years of education: Not on file  . Highest education level: Not on file   Occupational History  . Occupation: unemployed  Social Needs  . Financial resource strain: Not on file  . Food insecurity:    Worry: Not on file    Inability: Not on file  . Transportation needs:    Medical: Not on file    Non-medical: Not on file  Tobacco Use  . Smoking status: Current Every Day Smoker    Packs/day: 2.00    Years: 52.00    Pack years: 104.00    Types: Cigarettes  . Smokeless tobacco: Never Used  Substance and Sexual Activity  . Alcohol use: No    Alcohol/week: 0.0 standard drinks  . Drug use: No  . Sexual activity: Not on file  Lifestyle  . Physical activity:    Days per week: Not on file    Minutes per session: Not on file  . Stress: Not on file  Relationships  . Social connections:    Talks on phone: Not on file    Gets together: Not on file    Attends religious service: Not on file    Active member of club or organization: Not on file    Attends meetings of clubs or organizations: Not on file    Relationship status: Not on file  . Intimate partner violence:    Fear of current or ex partner: Not  on file    Emotionally abused: Not on file    Physically abused: Not on file    Forced sexual activity: Not on file  Other Topics Concern  . Not on file  Social History Narrative  . Not on file    Allergies  Allergen Reactions  . Nsaids Nausea Only    Family History  Problem Relation Age of Onset  . Colon cancer Mother 24  . Esophageal cancer Neg Hx   . Rectal cancer Neg Hx   . Stomach cancer Neg Hx     Prior to Admission medications   Not on File    Physical Exam: Vitals:   01/15/19 0430 01/15/19 0530 01/15/19 0630 01/15/19 0949  BP: 137/82 (!) 143/61 (!) 154/100   Pulse: (!) 107 (!) 132    Resp: 20 (!) 34 16   Temp:      TempSrc:      SpO2: 96% 95%  100%  Weight:      Height:         . General: Clearly delirious with wandering along the room unclothed, attempting to sit on the trash can, mumbling, and other clearly unusual  behaviors . Eyes:  PERRL, EOMI, normal lids, iris . ENT:  grossly normal hearing, lips & tongue, mmm . Neck:  no LAD, masses or thyromegaly . Cardiovascular:   RR with tachycardia, no m/r/g. No LE edema.  Marland Kitchen Respiratory:   CTA bilaterally with no wheezes/rales/rhonchi.  Normal respiratory effort. . Abdomen:  soft, NT, ND, NABS . Back:   normal alignment, no CVAT . Skin:  no rash or induration seen on limited exam . Musculoskeletal:  grossly normal tone BUE/BLE, good ROM, no bony abnormality . Psychiatric:  Quite delirious, speech mostly nonsensical and only somewhat redirectable . Neurologic:  CN 2-12 grossly intact, moves all extremities in coordinated fashion, sensation intact - somewhat limited by MS    Radiological Exams on Admission: Dg Chest 2 View  Result Date: 01/15/2019 CLINICAL DATA:  Patient started a new medication 3 days ago and since then has been seeing and hearing things that are not there. Decreased sleep and lethargy. Altered mental status. EXAM: CHEST - 2 VIEW COMPARISON:  08/03/2012 FINDINGS: Normal heart size and pulmonary vascularity. Bullous emphysematous changes in the upper lungs. Slight interstitial fibrosis, likely chronic bronchitis. No focal consolidation. No blunting of costophrenic angles. No pneumothorax. Mediastinal contours appear intact. Calcification of the aorta. Degenerative changes in the spine. IMPRESSION: Emphysematous and chronic bronchitic changes in the lungs. No evidence of active pulmonary disease. Electronically Signed   By: Lucienne Capers M.D.   On: 01/15/2019 02:02   Ct Head Wo Contrast  Result Date: 01/15/2019 CLINICAL DATA:  Altered mental status. EXAM: CT HEAD WITHOUT CONTRAST TECHNIQUE: Contiguous axial images were obtained from the base of the skull through the vertex without intravenous contrast. COMPARISON:  None. FINDINGS: Brain: No evidence of acute infarction, hemorrhage, hydrocephalus, extra-axial collection or mass lesion/mass  effect. Mild cerebral atrophy. Vascular: No hyperdense vessel or unexpected calcification. Skull: Calvarium appears intact. Sinuses/Orbits: Paranasal sinuses and mastoid air cells are clear. Other: None. IMPRESSION: No acute intracranial abnormalities. Electronically Signed   By: Lucienne Capers M.D.   On: 01/15/2019 02:30    EKG: Independently reviewed.  NSR with rate 84; nonspecific ST changes with no evidence of acute ischemia   Labs on Admission: I have personally reviewed the available labs and imaging studies at the time of the admission.  Pertinent labs:  VBG: 7.511/34.8/27.8 K+ 3.3 Glucose 124 Lactate 1.1 Normal CBC UA: small Hgb UDS: +opiates   Assessment/Plan Principal Problem:   Benzodiazepine withdrawal with delirium (HCC) Active Problems:   OSA (obstructive sleep apnea)   Delirium, thought to be due to BZD withdrawal -Patient with long-standing use of Xanax -Last Monday, this was discontinued and changed to Belsomra without apparent taper of BZD -She is floridly delirious at this time -Will observe for now -Will give standing Ativan IV as well as prn without anticipated transition to PO tomorrow -Evaluation otherwise unremarkable at this time and labs/imaging do not indicate another organic etiology -Psychiatry has evaluated the patient and is in agreement -Will need 1:1 sitter for safety  OSA -Continue home BIPAP  Tobacco dependence -Patient smokes 2 ppd -Nicotine patch added -Unable to counsel about smoking cessation at this time   DVT prophylaxis: Lovenox  Code Status:  Full - confirmed with family Family Communication: Daughter present throughout evaluation Disposition Plan:  Home once clinically improved Consults called: Psychiatry  Admission status: It is my clinical opinion that referral for OBSERVATION is reasonable and necessary in this patient based on the above information provided. The aforementioned taken together are felt to place the  patient at high risk for further clinical deterioration. However it is anticipated that the patient may be medically stable for discharge from the hospital within 24 to 48 hours.    Karmen Bongo MD [facility]   How to contact the [facility] Attending or Consulting provider [place] or covering provider during after hours [place], for this patient?  1. Check the care team in [facility] and look for a) attending/consulting TRH provider listed and b) the [facility] team listed 2. Log into [email or web address] and use 's universal password to access. If you do not have the password, please contact the hospital operator. 3. Locate the [facility] provider you are looking for under [facility] and page to a number that you can be directly reached. 4. If you still have difficulty reaching the provider, please page the [facility] (Director on Call) for the Hospitalists listed on amion for assistance.   01/15/2019, 2:48 PM

## 2019-01-15 NOTE — ED Notes (Signed)
ED TO INPATIENT HANDOFF REPORT  ED Nurse Name and Phone #:  Vicente Males [phone]  S Name/Age/Gender Rebekah Chapman 68 y.o. female Room/Bed: 019C/019C  Code Status   Code Status: Not on file  Home/SNF/Other Home Patient oriented to: self Is this baseline? No   Triage Complete: Triage complete  Chief Complaint possible side effects from medication: hallucination  Triage Note Patient BIB family for altered mental status. Per family patient was taken off Xanax and started on a new medication 3 days, since then she has started "seeing and hearing things that weren't there, just not her normal behavior". Family reports decreased sleep over the last 2 days but tonight has become lethargic. Denies nausea, vomiting, fever, or any other symptoms at this time.     Allergies Allergies  Allergen Reactions  . Nsaids Nausea Only    Level of Care/Admitting Diagnosis ED Disposition    ED Disposition Condition [facility] Area: [place] [[id number]]  Level of Care: Med-Surg [16]  I expect the patient will be discharged within 24 hours: Yes  LOW acuity---Tx typically complete <24 hrs---ACUTE conditions typically can be evaluated <24 hours---LABS likely to return to acceptable levels <24 hours---IS near functional baseline---EXPECTED to return to current living arrangement---NOT newly hypoxic: Meets criteria for 5C-Observation unit  Diagnosis: Benzodiazepine withdrawal with delirium [facility]) [[id number]]  Admitting Physician: Karmen Bongo [[id number]]  Attending Physician: Karmen Bongo [[id number]]  PT Class (Do Not Modify): Observation [104]  PT Acc Code (Do Not Modify): Observation [10022]       B Medical/Surgery History Past Medical History:  Diagnosis Date  . Anxiety   . Arthritis   . Osteoporosis   . Sleep apnea 2006   bipap used 2 yrs ago   Past Surgical History:  Procedure Laterality Date  . ABDOMINAL HYSTERECTOMY    . ANKLE SURGERY  2014   right side w  pins  . COLONOSCOPY  2011  . [place]   left side  . POLYPECTOMY       A IV Location/Drains/Wounds Patient Lines/Drains/Airways Status   Active Line/Drains/Airways    Name:   Placement date:   Placement time:   Site:   Days:   Peripheral IV 01/15/19 Right Antecubital   01/15/19    0102    Antecubital   less than 1          Intake/Output Last 24 hours No intake or output data in the 24 hours ending 01/15/19 0854  Labs/Imaging Results for orders placed or performed during the hospital encounter of 01/15/19 (from the past 48 hour(s))  Ethanol     Status: None   Collection Time: 01/15/19  1:11 AM  Result Value Ref Range   Alcohol, Ethyl (B) <10 <10 mg/dL    Comment: (NOTE) Lowest detectable limit for serum alcohol is 10 mg/dL. For medical purposes only. Performed at [facility], [place] [address] [place], [place] [address]   Ammonia     Status: None   Collection Time: 01/15/19  1:26 AM  Result Value Ref Range   Ammonia 24 9 - 35 umol/L    Comment: Performed at [facility], [place] [address] [place], [place] [address]  Comprehensive metabolic panel     Status: Abnormal   Collection Time: 01/15/19  1:26 AM  Result Value Ref Range   Sodium 138 135 - 145 mmol/L   Potassium 3.3 (L) 3.5 - 5.1 mmol/L   Chloride 101  98 - 111 mmol/L   CO2 25 22 - 32 mmol/L   Glucose, Bld 124 (H) 70 - 99 mg/dL   BUN 7 (L) 8 - 23 mg/dL   Creatinine, Ser 0.81 0.44 - 1.00 mg/dL   Calcium 10.1 8.9 - 10.3 mg/dL   Total Protein 7.6 6.5 - 8.1 g/dL   Albumin 3.9 3.5 - 5.0 g/dL   AST 17 15 - 41 U/L   ALT 7 0 - 44 U/L   Alkaline Phosphatase 71 38 - 126 U/L   Total Bilirubin 0.8 0.3 - 1.2 mg/dL   GFR calc non Af Amer >60 >60 mL/min   GFR calc Af Amer >60 >60 mL/min   Anion gap 12 5 - 15    Comment: Performed at [facility], [place] [address] [place], [place] [address]  Lactic acid, plasma     Status: None   Collection Time: 01/15/19  1:26 AM  Result Value Ref Range    Lactic Acid, Venous 1.1 0.5 - 1.9 mmol/L    Comment: Performed at [facility] [address], [place], [place] [address]  CBC WITH DIFFERENTIAL     Status: Abnormal   Collection Time: 01/15/19  1:26 AM  Result Value Ref Range   WBC 10.4 4.0 - 10.5 K/uL   RBC 4.13 3.87 - 5.11 MIL/uL   Hemoglobin 13.5 12.0 - 15.0 g/dL   HCT 41.1 36.0 - 46.0 %   MCV 99.5 80.0 - 100.0 fL   MCH 32.7 26.0 - 34.0 pg   MCHC 32.8 30.0 - 36.0 g/dL   RDW 13.1 11.5 - 15.5 %   Platelets 256 150 - 400 K/uL   nRBC 0.0 0.0 - 0.2 %   Neutrophils Relative % 77 %   Neutro Abs 8.0 (H) 1.7 - 7.7 K/uL   Lymphocytes Relative 15 %   Lymphs Abs 1.5 0.7 - 4.0 K/uL   Monocytes Relative 8 %   Monocytes Absolute 0.8 0.1 - 1.0 K/uL   Eosinophils Relative 0 %   Eosinophils Absolute 0.0 0.0 - 0.5 K/uL   Basophils Relative 0 %   Basophils Absolute 0.0 0.0 - 0.1 K/uL   Immature Granulocytes 0 %   Abs Immature Granulocytes 0.03 0.00 - 0.07 K/uL    Comment: Performed at [facility] [address], [place], [place] [address]  Urine rapid drug screen (hosp performed)     Status: Abnormal   Collection Time: 01/15/19  1:27 AM  Result Value Ref Range   Opiates POSITIVE (A) NONE DETECTED   Cocaine NONE DETECTED NONE DETECTED   Benzodiazepines NONE DETECTED NONE DETECTED   Amphetamines NONE DETECTED NONE DETECTED   Tetrahydrocannabinol NONE DETECTED NONE DETECTED   Barbiturates NONE DETECTED NONE DETECTED    Comment: (NOTE) DRUG SCREEN FOR MEDICAL PURPOSES ONLY.  IF CONFIRMATION IS NEEDED FOR ANY PURPOSE, NOTIFY LAB WITHIN 5 DAYS. LOWEST DETECTABLE LIMITS FOR URINE DRUG SCREEN Drug Class                     Cutoff (ng/mL) Amphetamine and metabolites    1000 Barbiturate and metabolites    200 Benzodiazepine                 244 Tricyclics and metabolites     300 Opiates and metabolites        300 Cocaine and metabolites        300 THC  50 Performed at [facility], [place]  [address] [place], [place] [address]   Urinalysis, Complete w Microscopic     Status: Abnormal   Collection Time: 01/15/19  1:27 AM  Result Value Ref Range   Color, Urine YELLOW YELLOW   APPearance CLEAR CLEAR   Specific Gravity, Urine 1.008 1.005 - 1.030   pH 7.0 5.0 - 8.0   Glucose, UA NEGATIVE NEGATIVE mg/dL   Hgb urine dipstick SMALL (A) NEGATIVE   Bilirubin Urine NEGATIVE NEGATIVE   Ketones, ur NEGATIVE NEGATIVE mg/dL   Protein, ur NEGATIVE NEGATIVE mg/dL   Nitrite NEGATIVE NEGATIVE   Leukocytes,Ua NEGATIVE NEGATIVE   RBC / HPF 0-5 0 - 5 RBC/hpf   WBC, UA 0-5 0 - 5 WBC/hpf   Bacteria, UA NONE SEEN NONE SEEN   Mucus PRESENT     Comment: Performed at [facility] [address], [place], [place] [address]  POCT I-Stat EG7     Status: Abnormal   Collection Time: 01/15/19  1:44 AM  Result Value Ref Range   pH, Ven 7.511 (H) 7.250 - 7.430   pCO2, Ven 34.8 (L) 44.0 - 60.0 mmHg   pO2, Ven 30.0 (LL) 32.0 - 45.0 mmHg   Bicarbonate 27.8 20.0 - 28.0 mmol/L   TCO2 29 22 - 32 mmol/L   O2 Saturation 65.0 %   Acid-Base Excess 5.0 (H) 0.0 - 2.0 mmol/L   Sodium 138 135 - 145 mmol/L   Potassium 3.7 3.5 - 5.1 mmol/L   Calcium, Ion 1.13 (L) 1.15 - 1.40 mmol/L   HCT 39.0 36.0 - 46.0 %   Hemoglobin 13.3 12.0 - 15.0 g/dL   Patient temperature 98.7 F    Sample type VENOUS    Comment NOTIFIED PHYSICIAN   CBG monitoring, ED     Status: Abnormal   Collection Time: 01/15/19  7:28 AM  Result Value Ref Range   Glucose-Capillary 119 (H) 70 - 99 mg/dL   Dg Chest 2 View  Result Date: 01/15/2019 CLINICAL DATA:  Patient started a new medication 3 days ago and since then has been seeing and hearing things that are not there. Decreased sleep and lethargy. Altered mental status. EXAM: CHEST - 2 VIEW COMPARISON:  08/03/2012 FINDINGS: Normal heart size and pulmonary vascularity. Bullous emphysematous changes in the upper lungs. Slight interstitial fibrosis, likely chronic bronchitis. No focal  consolidation. No blunting of costophrenic angles. No pneumothorax. Mediastinal contours appear intact. Calcification of the aorta. Degenerative changes in the spine. IMPRESSION: Emphysematous and chronic bronchitic changes in the lungs. No evidence of active pulmonary disease. Electronically Signed   By: Lucienne Capers M.D.   On: 01/15/2019 02:02   Ct Head Wo Contrast  Result Date: 01/15/2019 CLINICAL DATA:  Altered mental status. EXAM: CT HEAD WITHOUT CONTRAST TECHNIQUE: Contiguous axial images were obtained from the base of the skull through the vertex without intravenous contrast. COMPARISON:  None. FINDINGS: Brain: No evidence of acute infarction, hemorrhage, hydrocephalus, extra-axial collection or mass lesion/mass effect. Mild cerebral atrophy. Vascular: No hyperdense vessel or unexpected calcification. Skull: Calvarium appears intact. Sinuses/Orbits: Paranasal sinuses and mastoid air cells are clear. Other: None. IMPRESSION: No acute intracranial abnormalities. Electronically Signed   By: Lucienne Capers M.D.   On: 01/15/2019 02:30    Pending Labs Unresulted Labs (From admission, onward)    Start     Ordered   01/15/19 0111  Blood Cultures (routine x 2)  BLOOD CULTURE X 2,   STAT  01/15/19 0111   01/15/19 0111  Urine culture  ONCE - STAT,   STAT     01/15/19 0111          Vitals/Pain Today's Vitals   01/15/19 0400 01/15/19 0430 01/15/19 0530 01/15/19 0630  BP: (!) 142/77 137/82 (!) 143/61 (!) 154/100  Pulse: (!) 116 (!) 107 (!) 132   Resp: 16 20 (!) 34 16  Temp:      TempSrc:      SpO2: 96% 96% 95%   Weight:      Height:      PainSc:        Isolation Precautions No active isolations  Medications Medications  LORazepam (ATIVAN) injection 0-4 mg (1 mg Intravenous Given 01/15/19 0841)    Or  LORazepam (ATIVAN) tablet 0-4 mg ( Oral See Alternative 01/15/19 0841)  LORazepam (ATIVAN) injection 0-4 mg (has no administration in time range)    Or  LORazepam (ATIVAN)  tablet 0-4 mg (has no administration in time range)  thiamine (VITAMIN B-1) tablet 100 mg (has no administration in time range)    Or  thiamine (B-1) injection 100 mg (has no administration in time range)  acetaminophen (TYLENOL) tablet 650 mg (650 mg Oral Given 01/15/19 0343)  LORazepam (ATIVAN) tablet 1 mg (1 mg Oral Given 01/15/19 0606)  LORazepam (ATIVAN) tablet 1 mg (1 mg Oral Given 01/15/19 0750)    Mobility walks High fall risk   Focused Assessments Was taken off her xanax 3 days ago, having a bad transition.    R Recommendations: See Admitting Provider Note  Report given to:

## 2019-01-16 ENCOUNTER — Other Ambulatory Visit: Payer: Self-pay

## 2019-01-16 DIAGNOSIS — F13231 Sedative, hypnotic or anxiolytic dependence with withdrawal delirium: Secondary | ICD-10-CM | POA: Diagnosis not present

## 2019-01-16 DIAGNOSIS — G4733 Obstructive sleep apnea (adult) (pediatric): Secondary | ICD-10-CM | POA: Diagnosis not present

## 2019-01-16 LAB — BASIC METABOLIC PANEL
ANION GAP: 8 (ref 5–15)
BUN: 12 mg/dL (ref 8–23)
CALCIUM: 9.6 mg/dL (ref 8.9–10.3)
CO2: 26 mmol/L (ref 22–32)
Chloride: 105 mmol/L (ref 98–111)
Creatinine, Ser: 0.83 mg/dL (ref 0.44–1.00)
GFR calc Af Amer: >60 mL/min (ref >60)
GFR calc non Af Amer: >60 mL/min (ref >60)
Glucose, Bld: 94 mg/dL (ref 70–99)
Potassium: 4.3 mmol/L (ref 3.5–5.1)
Sodium: 139 mmol/L (ref 135–145)

## 2019-01-16 LAB — URINE CULTURE: Culture: NO GROWTH

## 2019-01-16 LAB — CBC
HCT: 41.4 % (ref 36.0–46.0)
Hemoglobin: 13.3 g/dL (ref 12.0–15.0)
MCH: 31.8 pg (ref 26.0–34.0)
MCHC: 32.1 g/dL (ref 30.0–36.0)
MCV: 99 fL (ref 80.0–100.0)
Platelets: 248 10*3/uL (ref 150–400)
RBC: 4.18 MIL/uL (ref 3.87–5.11)
RDW: 13.1 % (ref 11.5–15.5)
WBC: 9 10*3/uL (ref 4.0–10.5)
nRBC: 0 % (ref 0.0–0.2)

## 2019-01-16 MED ORDER — ALPRAZOLAM 1 MG PO TABS: 1.0000 mg | ORAL_TABLET | Freq: Every day | ORAL | 0 refills | Status: AC

## 2019-01-16 MED ORDER — ALPRAZOLAM 0.5 MG PO TABS: 1.0000 mg | ORAL_TABLET | Freq: Two times a day (BID) | ORAL | Status: DC

## 2019-01-16 MED ORDER — GABAPENTIN 300 MG PO CAPS: 300.0000 mg | ORAL_CAPSULE | Freq: Three times a day (TID) | ORAL | Status: AC

## 2019-01-16 MED ORDER — NICOTINE 21 MG/24HR TD PT24
21.0000 mg | MEDICATED_PATCH | Freq: Every day | TRANSDERMAL | Status: DC
  Administered 2019-01-16: 21 mg via TRANSDERMAL
  Filled 2019-01-16 (×2): qty 1

## 2019-01-16 MED ORDER — ALPRAZOLAM 0.5 MG PO TABS: 1.0000 mg | ORAL_TABLET | Freq: Every day | ORAL | Status: DC

## 2019-01-16 NOTE — Progress Notes (Signed)
Pt discharged to home in stable condition.  All discharge instructions reviewed with and given to pt.  Pt transported off unit via wheelchair with transporters x 1 at chairside.  [id number]

## 2019-01-16 NOTE — Discharge Summary (Signed)
Physician Discharge Summary  Rebekah Chapman BOF:[id number] DOB: [birth year] DOA: 01/15/2019  PCP: Briscoe Deutscher, MD  Admit date: 01/15/2019 Discharge date: 01/16/2019  Admitted From: Home Disposition:  Home  Recommendations for Outpatient Follow-up:  1. Follow up with PCP in 1 weeks 2. Outpatient follow-up with psychiatrist at earliest convenience 3. Follow up in ED if symptoms worsen or new appear   Home Health: No Equipment/Devices: None  Discharge Condition: Stable CODE STATUS: Full Diet recommendation: Heart healthy  Brief/Interim Summary: 68 year old female with history of OSA and anxiety/depression presented with hallucinations.  She was on Xanax for many years which was recently changed to [place] and her Xanax was stopped.  She started having visual hallucinations.  She was admitted for benzodiazepine withdrawal.  Psychiatry was consulted who recommended medical management and no need for psychiatric admission.  Her symptoms have improved.  She was treated with IV Ativan.  She will be discharged on low-dose of oral Xanax and she will need to follow-up with her PCP/psychiatrist.  Discharge Diagnoses:  Principal Problem:   Benzodiazepine withdrawal with delirium ([place]) Active Problems:   OSA (obstructive sleep apnea)  Delirium most likely secondary to benzodiazepine withdrawal -Patient had a longstanding use of Xanax.  Her Xanax was recently discontinued by her PCP and she was started on Belsomra. -She presented with hallucinations.  Was given IV Ativan.  Currently mental status much more better.  She feels okay to go home.  She will be put on oral Xanax 1 mg at bedtime; she used to take 2 mg oral Xanax at bedtime.  Outpatient follow-up with PCP at earliest convenience.  She will also need psychiatry follow-up. -Psychiatry was consulted in the ED who recommended medical management and no need for inpatient psychiatric admission.  OSA--continue home BiPAP   Discharge  Instructions  Discharge Instructions    Call MD for:  extreme fatigue   Complete by:  As directed    Call MD for:  persistant dizziness or light-headedness   Complete by:  As directed    Diet - low sodium heart healthy   Complete by:  As directed    Increase activity slowly   Complete by:  As directed      Allergies as of 01/16/2019      Reactions   Nsaids Nausea Only      Medication List    TAKE these medications   alendronate 70 MG tablet Commonly known as:  FOSAMAX Take 70 mg by mouth once a week. Take with a full glass of water on an empty stomach.   ALPRAZolam 1 MG tablet Commonly known as:  XANAX Take 1 tablet (1 mg total) by mouth at bedtime.   amitriptyline 25 MG tablet Commonly known as:  ELAVIL Take 50 mg by mouth at bedtime.   Belsomra 5 MG Tabs Generic drug:  Suvorexant Take 5 mg by mouth at bedtime as needed.   DULoxetine 60 MG capsule Commonly known as:  CYMBALTA Take 60 mg by mouth daily.   gabapentin 300 MG capsule Commonly known as:  NEURONTIN Take 1 capsule (300 mg total) by mouth 3 (three) times daily. What changed:  when to take this   HYDROcodone-acetaminophen 5-325 MG tablet Commonly known as:  NORCO/VICODIN Take 1 tablet by mouth every 8 (eight) hours as needed for moderate pain.      Follow-up Information    Briscoe Deutscher, MD. Schedule an appointment as soon as possible for a visit in 1 week(s).   Specialty:  Family  Medicine Contact information: [address] [place] [address] [phone]        Psychiatrist. Schedule an appointment as soon as possible for a visit in 1 week(s).          Allergies  Allergen Reactions  . Nsaids Nausea Only    Consultations:  Psychiatry in the ED   Procedures/Studies: Dg Chest 2 View  Result Date: 01/15/2019 CLINICAL DATA:  Patient started a new medication 3 days ago and since then has been seeing and hearing things that are not there. Decreased sleep and lethargy.  Altered mental status. EXAM: CHEST - 2 VIEW COMPARISON:  08/03/2012 FINDINGS: Normal heart size and pulmonary vascularity. Bullous emphysematous changes in the upper lungs. Slight interstitial fibrosis, likely chronic bronchitis. No focal consolidation. No blunting of costophrenic angles. No pneumothorax. Mediastinal contours appear intact. Calcification of the aorta. Degenerative changes in the spine. IMPRESSION: Emphysematous and chronic bronchitic changes in the lungs. No evidence of active pulmonary disease. Electronically Signed   By: Lucienne Capers M.D.   On: 01/15/2019 02:02   Ct Head Wo Contrast  Result Date: 01/15/2019 CLINICAL DATA:  Altered mental status. EXAM: CT HEAD WITHOUT CONTRAST TECHNIQUE: Contiguous axial images were obtained from the base of the skull through the vertex without intravenous contrast. COMPARISON:  None. FINDINGS: Brain: No evidence of acute infarction, hemorrhage, hydrocephalus, extra-axial collection or mass lesion/mass effect. Mild cerebral atrophy. Vascular: No hyperdense vessel or unexpected calcification. Skull: Calvarium appears intact. Sinuses/Orbits: Paranasal sinuses and mastoid air cells are clear. Other: None. IMPRESSION: No acute intracranial abnormalities. Electronically Signed   By: Lucienne Capers M.D.   On: 01/15/2019 02:30       Subjective: Patient seen and examined at bedside.  She is sleepy, wakes up on calling her name.  Feels better.  Denies any more hallucinations.  Answers questions appropriately.  He is okay going home today.  No overnight fever or vomiting.  Discharge Exam: Vitals:   01/15/19 2244 01/16/19 0830  BP: (!) 133/95 128/76  Pulse: (!) 118 (!) 111  Resp: 18 20  Temp: 98.5 F (36.9 C) 98.5 F (36.9 C)  SpO2: 97% 98%    General: Pt is sleepy, wakes up on calling her name.  Answers questions appropriately.  Awake and alert. Cardiovascular: Intermittent tachycardia, S1/S2 + Respiratory: bilateral decreased breath sounds  at bases Abdominal: Soft, NT, ND, bowel sounds + Extremities: no edema, no cyanosis    The results of significant diagnostics from this hospitalization (including imaging, microbiology, ancillary and laboratory) are listed below for reference.     Microbiology: Recent Results (from the past 240 hour(s))  Blood Cultures (routine x 2)     Status: None (Preliminary result)   Collection Time: 01/15/19  1:27 AM  Result Value Ref Range Status   Specimen Description BLOOD RIGHT ARM  Final   Special Requests   Final    BOTTLES DRAWN AEROBIC AND ANAEROBIC Blood Culture adequate volume   Culture   Final    NO GROWTH 1 DAY Performed at [facility], [address], [place], [place] [address]    Report Status PENDING  Incomplete  Urine culture     Status: None   Collection Time: 01/15/19  1:27 AM  Result Value Ref Range Status   Specimen Description URINE, RANDOM  Final   Special Requests NONE  Final   Culture   Final    NO GROWTH Performed at [facility], [address]  [address], [place], [place] [address]    Report Status 01/16/2019 FINAL  Final  Blood Cultures (routine x 2)     Status: None (Preliminary result)   Collection Time: 01/15/19  1:35 AM  Result Value Ref Range Status   Specimen Description BLOOD LEFT HAND  Final   Special Requests   Final    BOTTLES DRAWN AEROBIC AND ANAEROBIC Blood Culture adequate volume   Culture   Final    NO GROWTH 1 DAY Performed at [facility], [place] [address], [place], [place] [address]    Report Status PENDING  Incomplete     Labs: BNP (last 3 results) No results for input(s): BNP in the last 8760 hours. Basic Metabolic Panel: Recent Labs  Lab 01/15/19 0126 01/15/19 0144 01/16/19 0549  NA 138 138 139  K 3.3* 3.7 4.3  CL 101  --  105  CO2 25  --  26  GLUCOSE 124*  --  94  BUN 7*  --  12  CREATININE 0.81  --  0.83  CALCIUM 10.1  --  9.6   Liver Function Tests: Recent Labs  Lab 01/15/19 0126  AST 17  ALT 7   ALKPHOS 71  BILITOT 0.8  PROT 7.6  ALBUMIN 3.9   No results for input(s): LIPASE, AMYLASE in the last 168 hours. Recent Labs  Lab 01/15/19 0126  AMMONIA 24   CBC: Recent Labs  Lab 01/15/19 0126 01/15/19 0144 01/16/19 0549  WBC 10.4  --  9.0  NEUTROABS 8.0*  --   --   HGB 13.5 13.3 13.3  HCT 41.1 39.0 41.4  MCV 99.5  --  99.0  PLT 256  --  248   Cardiac Enzymes: No results for input(s): CKTOTAL, CKMB, CKMBINDEX, TROPONINI in the last 168 hours. BNP: Invalid input(s): POCBNP CBG: Recent Labs  Lab 01/15/19 0728  GLUCAP 119*   D-Dimer No results for input(s): DDIMER in the last 72 hours. Hgb A1c No results for input(s): HGBA1C in the last 72 hours. Lipid Profile No results for input(s): CHOL, HDL, LDLCALC, TRIG, CHOLHDL, LDLDIRECT in the last 72 hours. Thyroid function studies No results for input(s): TSH, T4TOTAL, T3FREE, THYROIDAB in the last 72 hours.  Invalid input(s): FREET3 Anemia work up No results for input(s): VITAMINB12, FOLATE, FERRITIN, TIBC, IRON, RETICCTPCT in the last 72 hours. Urinalysis    Component Value Date/Time   COLORURINE YELLOW 01/15/2019 0127   APPEARANCEUR CLEAR 01/15/2019 0127   LABSPEC 1.008 01/15/2019 0127   PHURINE 7.0 01/15/2019 0127   GLUCOSEU NEGATIVE 01/15/2019 0127   HGBUR SMALL (A) 01/15/2019 0127   BILIRUBINUR NEGATIVE 01/15/2019 0127   KETONESUR NEGATIVE 01/15/2019 0127   PROTEINUR NEGATIVE 01/15/2019 0127   UROBILINOGEN 1.0 09/01/2010 2048   NITRITE NEGATIVE 01/15/2019 0127   LEUKOCYTESUR NEGATIVE 01/15/2019 0127   Sepsis Labs Invalid input(s): PROCALCITONIN,  WBC,  LACTICIDVEN Microbiology Recent Results (from the past 240 hour(s))  Blood Cultures (routine x 2)     Status: None (Preliminary result)   Collection Time: 01/15/19  1:27 AM  Result Value Ref Range Status   Specimen Description BLOOD RIGHT ARM  Final   Special Requests   Final    BOTTLES DRAWN AEROBIC AND ANAEROBIC Blood Culture adequate volume    Culture   Final    NO GROWTH 1 DAY Performed at [facility], [address] [place], [place] [address]    Report Status PENDING  Incomplete  Urine culture  Status: None   Collection Time: 01/15/19  1:27 AM  Result Value Ref Range Status   Specimen Description URINE, RANDOM  Final   Special Requests NONE  Final   Culture   Final    NO GROWTH Performed at [facility], [address], [place], [place] [address]    Report Status 01/16/2019 FINAL  Final  Blood Cultures (routine x 2)     Status: None (Preliminary result)   Collection Time: 01/15/19  1:35 AM  Result Value Ref Range Status   Specimen Description BLOOD LEFT HAND  Final   Special Requests   Final    BOTTLES DRAWN AEROBIC AND ANAEROBIC Blood Culture adequate volume   Culture   Final    NO GROWTH 1 DAY Performed at [facility], [place] [address] [place], [place] [address]    Report Status PENDING  Incomplete     Time coordinating discharge: 35 minutes  SIGNED:   Aline August, MD  [facility] 01/16/2019, 11:47 AM

## 2019-01-17 LAB — HIV ANTIBODY (ROUTINE TESTING W REFLEX): HIV SCREEN 4TH GENERATION: NONREACTIVE

## 2019-01-18 DIAGNOSIS — F17200 Nicotine dependence, unspecified, uncomplicated: Secondary | ICD-10-CM | POA: Diagnosis not present

## 2019-01-18 DIAGNOSIS — Z09 Encounter for follow-up examination after completed treatment for conditions other than malignant neoplasm: Secondary | ICD-10-CM | POA: Diagnosis not present

## 2019-01-18 DIAGNOSIS — F1190 Opioid use, unspecified, uncomplicated: Secondary | ICD-10-CM | POA: Diagnosis not present

## 2019-01-18 DIAGNOSIS — G479 Sleep disorder, unspecified: Secondary | ICD-10-CM | POA: Diagnosis not present

## 2019-01-18 DIAGNOSIS — M25571 Pain in right ankle and joints of right foot: Secondary | ICD-10-CM | POA: Diagnosis not present

## 2019-01-20 LAB — CULTURE, BLOOD (ROUTINE X 2)
Culture: NO GROWTH
Culture: NO GROWTH
SPECIAL REQUESTS: ADEQUATE
Special Requests: ADEQUATE

## 2019-01-25 DIAGNOSIS — R911 Solitary pulmonary nodule: Secondary | ICD-10-CM | POA: Diagnosis not present

## 2019-01-25 DIAGNOSIS — J3089 Other allergic rhinitis: Secondary | ICD-10-CM | POA: Diagnosis not present

## 2019-01-25 DIAGNOSIS — G4733 Obstructive sleep apnea (adult) (pediatric): Secondary | ICD-10-CM | POA: Diagnosis not present

## 2019-01-25 DIAGNOSIS — F17210 Nicotine dependence, cigarettes, uncomplicated: Secondary | ICD-10-CM | POA: Diagnosis not present

## 2019-02-22 ENCOUNTER — Emergency Department (HOSPITAL_COMMUNITY)
Admission: EM | Admit: 2019-02-22 | Discharge: 2019-02-22 | Disposition: A | Discharge status: Home / Self Care | Payer: Medicare HMO | Attending: Emergency Medicine | Admitting: Emergency Medicine

## 2019-02-22 ENCOUNTER — Emergency Department (HOSPITAL_COMMUNITY): Payer: Medicare HMO

## 2019-02-22 ENCOUNTER — Encounter (HOSPITAL_COMMUNITY): Payer: Self-pay | Admitting: Emergency Medicine

## 2019-02-22 ENCOUNTER — Other Ambulatory Visit: Payer: Self-pay

## 2019-02-22 DIAGNOSIS — F17210 Nicotine dependence, cigarettes, uncomplicated: Secondary | ICD-10-CM | POA: Diagnosis not present

## 2019-02-22 DIAGNOSIS — Y939 Activity, unspecified: Secondary | ICD-10-CM | POA: Diagnosis not present

## 2019-02-22 DIAGNOSIS — S0990XA Unspecified injury of head, initial encounter: Secondary | ICD-10-CM

## 2019-02-22 DIAGNOSIS — S0181XA Laceration without foreign body of other part of head, initial encounter: Secondary | ICD-10-CM | POA: Insufficient documentation

## 2019-02-22 DIAGNOSIS — Z23 Encounter for immunization: Secondary | ICD-10-CM | POA: Insufficient documentation

## 2019-02-22 DIAGNOSIS — R238 Other skin changes: Secondary | ICD-10-CM | POA: Diagnosis not present

## 2019-02-22 DIAGNOSIS — Z79899 Other long term (current) drug therapy: Secondary | ICD-10-CM | POA: Insufficient documentation

## 2019-02-22 DIAGNOSIS — T148XXA Other injury of unspecified body region, initial encounter: Secondary | ICD-10-CM

## 2019-02-22 DIAGNOSIS — Y92009 Unspecified place in unspecified non-institutional (private) residence as the place of occurrence of the external cause: Secondary | ICD-10-CM | POA: Diagnosis not present

## 2019-02-22 DIAGNOSIS — Y999 Unspecified external cause status: Secondary | ICD-10-CM | POA: Insufficient documentation

## 2019-02-22 DIAGNOSIS — S199XXA Unspecified injury of neck, initial encounter: Secondary | ICD-10-CM | POA: Diagnosis not present

## 2019-02-22 MED ORDER — TETANUS-DIPHTH-ACELL PERTUSSIS 5-2.5-18.5 LF-MCG/0.5 IM SUSP
0.5000 mL | Freq: Once | INTRAMUSCULAR | Status: AC
  Administered 2019-02-22: 16:00:00 0.5 mL via INTRAMUSCULAR
  Filled 2019-02-22: qty 0.5
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
## 2019-02-22 NOTE — ED Notes (Signed)
Laceration to forehead irrigated with saline and cleaned
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
## 2019-02-22 NOTE — Discharge Instructions (Signed)
Take your usual prescriptions as previously directed. Take over the counter tylenol, as directed on packaging, as needed for discomfort. The skin "glue" will flake off by itself; do not rub or pick at it. Keep the area clean and dry. Call your regular medical doctor tomorrow to schedule a follow up appointment within the next 3 days.  Return to the Emergency Department immediately sooner if worsening.
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
## 2019-03-02 DIAGNOSIS — Z09 Encounter for follow-up examination after completed treatment for conditions other than malignant neoplasm: Secondary | ICD-10-CM | POA: Diagnosis not present

## 2019-03-02 DIAGNOSIS — G894 Chronic pain syndrome: Secondary | ICD-10-CM | POA: Diagnosis not present

## 2019-03-02 DIAGNOSIS — S0101XD Laceration without foreign body of scalp, subsequent encounter: Secondary | ICD-10-CM | POA: Diagnosis not present

## 2019-03-02 DIAGNOSIS — F1190 Opioid use, unspecified, uncomplicated: Secondary | ICD-10-CM | POA: Diagnosis not present

## 2019-03-29 DIAGNOSIS — R918 Other nonspecific abnormal finding of lung field: Secondary | ICD-10-CM | POA: Diagnosis not present

## 2019-03-29 DIAGNOSIS — F17210 Nicotine dependence, cigarettes, uncomplicated: Secondary | ICD-10-CM | POA: Diagnosis not present

## 2019-03-29 DIAGNOSIS — R911 Solitary pulmonary nodule: Secondary | ICD-10-CM | POA: Diagnosis not present

## 2019-04-04 DIAGNOSIS — G4733 Obstructive sleep apnea (adult) (pediatric): Secondary | ICD-10-CM | POA: Diagnosis not present

## 2019-06-16 DIAGNOSIS — F1190 Opioid use, unspecified, uncomplicated: Secondary | ICD-10-CM | POA: Diagnosis not present

## 2019-06-16 DIAGNOSIS — G894 Chronic pain syndrome: Secondary | ICD-10-CM | POA: Diagnosis not present

## 2019-06-16 DIAGNOSIS — G479 Sleep disorder, unspecified: Secondary | ICD-10-CM | POA: Diagnosis not present

## 2019-07-05 DIAGNOSIS — G4733 Obstructive sleep apnea (adult) (pediatric): Secondary | ICD-10-CM | POA: Diagnosis not present

## 2019-09-14 DIAGNOSIS — G4700 Insomnia, unspecified: Secondary | ICD-10-CM | POA: Diagnosis not present

## 2019-09-14 DIAGNOSIS — E559 Vitamin D deficiency, unspecified: Secondary | ICD-10-CM | POA: Diagnosis not present

## 2019-09-14 DIAGNOSIS — Z0000 Encounter for general adult medical examination without abnormal findings: Secondary | ICD-10-CM | POA: Diagnosis not present

## 2019-09-14 DIAGNOSIS — Z23 Encounter for immunization: Secondary | ICD-10-CM | POA: Diagnosis not present

## 2019-10-05 DIAGNOSIS — G4733 Obstructive sleep apnea (adult) (pediatric): Secondary | ICD-10-CM | POA: Diagnosis not present

## 2019-10-06 DIAGNOSIS — G894 Chronic pain syndrome: Secondary | ICD-10-CM | POA: Diagnosis not present

## 2019-10-06 DIAGNOSIS — G4700 Insomnia, unspecified: Secondary | ICD-10-CM | POA: Diagnosis not present

## 2019-10-06 DIAGNOSIS — F1190 Opioid use, unspecified, uncomplicated: Secondary | ICD-10-CM | POA: Diagnosis not present

## 2019-10-06 DIAGNOSIS — G5790 Unspecified mononeuropathy of unspecified lower limb: Secondary | ICD-10-CM | POA: Diagnosis not present

## 2019-10-06 DIAGNOSIS — G5791 Unspecified mononeuropathy of right lower limb: Secondary | ICD-10-CM | POA: Diagnosis not present

## 2019-10-23 DIAGNOSIS — Z1231 Encounter for screening mammogram for malignant neoplasm of breast: Secondary | ICD-10-CM | POA: Diagnosis not present

## 2019-11-10 DIAGNOSIS — M545 Low back pain: Secondary | ICD-10-CM | POA: Diagnosis not present

## 2019-11-10 DIAGNOSIS — G479 Sleep disorder, unspecified: Secondary | ICD-10-CM | POA: Diagnosis not present

## 2019-11-10 DIAGNOSIS — F1190 Opioid use, unspecified, uncomplicated: Secondary | ICD-10-CM | POA: Diagnosis not present

## 2019-11-10 DIAGNOSIS — G894 Chronic pain syndrome: Secondary | ICD-10-CM | POA: Diagnosis not present

## 2019-11-10 DIAGNOSIS — M549 Dorsalgia, unspecified: Secondary | ICD-10-CM | POA: Diagnosis not present

## 2019-11-10 DIAGNOSIS — Z720 Tobacco use: Secondary | ICD-10-CM | POA: Diagnosis not present

## 2019-11-24 DIAGNOSIS — M5137 Other intervertebral disc degeneration, lumbosacral region: Secondary | ICD-10-CM | POA: Diagnosis not present

## 2019-11-24 DIAGNOSIS — M47817 Spondylosis without myelopathy or radiculopathy, lumbosacral region: Secondary | ICD-10-CM | POA: Diagnosis not present

## 2019-11-24 DIAGNOSIS — M5134 Other intervertebral disc degeneration, thoracic region: Secondary | ICD-10-CM | POA: Diagnosis not present

## 2019-11-24 DIAGNOSIS — M545 Low back pain: Secondary | ICD-10-CM | POA: Diagnosis not present

## 2019-11-24 DIAGNOSIS — M549 Dorsalgia, unspecified: Secondary | ICD-10-CM | POA: Diagnosis not present

## 2019-11-24 DIAGNOSIS — M47816 Spondylosis without myelopathy or radiculopathy, lumbar region: Secondary | ICD-10-CM | POA: Diagnosis not present

## 2019-11-24 DIAGNOSIS — M5136 Other intervertebral disc degeneration, lumbar region: Secondary | ICD-10-CM | POA: Diagnosis not present

## 2019-11-24 DIAGNOSIS — M4804 Spinal stenosis, thoracic region: Secondary | ICD-10-CM | POA: Diagnosis not present

## 2020-01-04 DIAGNOSIS — G4733 Obstructive sleep apnea (adult) (pediatric): Secondary | ICD-10-CM | POA: Diagnosis not present

## 2020-01-17 DIAGNOSIS — M519 Unspecified thoracic, thoracolumbar and lumbosacral intervertebral disc disorder: Secondary | ICD-10-CM | POA: Diagnosis not present

## 2020-01-26 ENCOUNTER — Ambulatory Visit: Payer: Medicare HMO | Admitting: Rehabilitative and Restorative Service Providers"

## 2020-01-26 ENCOUNTER — Encounter (INDEPENDENT_AMBULATORY_CARE_PROVIDER_SITE_OTHER): Payer: Self-pay

## 2020-01-26 ENCOUNTER — Other Ambulatory Visit: Payer: Self-pay

## 2020-01-26 ENCOUNTER — Encounter: Payer: Self-pay | Admitting: Rehabilitative and Restorative Service Providers"

## 2020-01-26 DIAGNOSIS — G8929 Other chronic pain: Secondary | ICD-10-CM | POA: Diagnosis not present

## 2020-01-26 DIAGNOSIS — M546 Pain in thoracic spine: Secondary | ICD-10-CM | POA: Diagnosis not present

## 2020-01-26 DIAGNOSIS — M545 Low back pain, unspecified: Secondary | ICD-10-CM

## 2020-01-26 DIAGNOSIS — H524 Presbyopia: Secondary | ICD-10-CM | POA: Diagnosis not present

## 2020-01-26 DIAGNOSIS — H269 Unspecified cataract: Secondary | ICD-10-CM | POA: Diagnosis not present

## 2020-01-26 DIAGNOSIS — H52209 Unspecified astigmatism, unspecified eye: Secondary | ICD-10-CM | POA: Diagnosis not present

## 2020-01-26 DIAGNOSIS — H5203 Hypermetropia, bilateral: Secondary | ICD-10-CM | POA: Diagnosis not present

## 2020-01-26 DIAGNOSIS — M6281 Muscle weakness (generalized): Secondary | ICD-10-CM | POA: Diagnosis not present

## 2020-01-26 DIAGNOSIS — R293 Abnormal posture: Secondary | ICD-10-CM | POA: Diagnosis not present

## 2020-01-26 NOTE — Patient Instructions (Signed)
HIP: Hamstrings - Supine  Place strap around foot. Raise leg up, keeping knee straight.  Bend opposite knee to protect back if indicated. Hold 30 seconds. 1 reps per set, 2-3 sets per day  Outer Hip Stretch: Reclined IT Band Stretch (Strap) Right leg only    Strap around one foot, pull leg across body until you feel a pull or stretch in the outside of your hip, with shoulders on mat. Hold for 30 seconds. Repeat 1-2 times each leg. 2-3 times/day.  Piriformis Stretch   Lying on back, pull right knee toward opposite shoulder. Hold 30 seconds. Repeat 3 times. Do 2-3 sessions per day.    Quads / HF, Prone KNEE: Quadriceps - Prone    Place strap around ankle. Bring ankle toward buttocks. Press hip into surface. Hold 30 seconds. Repeat 3 times per session. Do 2-3 sessions per day.  Standing and sitting with swim noodle along spine   Shoulder Blade Squeeze    Rotate shoulders back, then squeeze shoulder blades down and back . Hold 10 sec Repeat __10__ times. Do _several ___ sessions per day.    TENS UNIT: This is helpful for muscle pain and spasm.   Search and Purchase a TENS 7000 2nd edition at www.tenspros.com. It should be less than $30.     TENS unit instructions: Do not shower or bathe with the unit on Turn the unit off before removing electrodes or batteries If the electrodes lose stickiness add a drop of water to the electrodes after they are disconnected from the unit and place on plastic sheet. If you continued to have difficulty, call the TENS unit company to purchase more electrodes. Do not apply lotion on the skin area prior to use. Make sure the skin is clean and dry as this will help prolong the life of the electrodes. After use, always check skin for unusual red areas, rash or other skin difficulties. If there are any skin problems, does not apply electrodes to the same area. Never remove the electrodes from the unit by pulling the wires. Do not use the TENS  unit or electrodes other than as directed. Do not change electrode placement without consultating your therapist or physician. Keep 2 fingers with between each electrode.   Sleeping on Back  Place pillow under knees. A pillow with cervical support and a roll around waist are also helpful. Copyright  VHI. All rights reserved.  Sleeping on Side Place pillow between knees. Use cervical support under neck and a roll around waist as needed. Copyright  VHI. All rights reserved.   Sleeping on Stomach   If this is the only desirable sleeping position, place pillow under lower legs, and under stomach or chest as needed.  Posture - Sitting   Sit upright, head facing forward. Try using a roll to support lower back. Keep shoulders relaxed, and avoid rounded back. Keep hips level with knees. Avoid crossing legs for long periods. Stand to Sit / Sit to Stand   To sit: Bend knees to lower self onto front edge of chair, then scoot back on seat. To stand: Reverse sequence by placing one foot forward, and scoot to front of seat. Use rocking motion to stand up.   Work Height and Reach  Ideal work height is no more than 2 to 4 inches below elbow level when standing, and at elbow level when sitting. Reaching should be limited to arm's length, with elbows slightly bent.  Bending  Bend at hips and knees, not  back. Keep feet shoulder-width apart.    Posture - Standing   Good posture is important. Avoid slouching and forward head thrust. Maintain curve in low back and align ears over shoul- ders, hips over ankles.  Alternating Positions   Alternate tasks and change positions frequently to reduce fatigue and muscle tension. Take rest breaks. Computer Work   Position work to Programmer, multimedia. Use proper work and seat height. Keep shoulders back and down, wrists straight, and elbows at right angles. Use chair that provides full back support. Add footrest and lumbar roll as needed.  Getting Into /  Out of Car  Lower self onto seat, scoot back, then bring in one leg at a time. Reverse sequence to get out.  Dressing  Lie on back to pull socks or slacks over feet, or sit and bend leg while keeping back straight.    Housework - Sink  Place one foot on ledge of cabinet under sink when standing at sink for prolonged periods.   Pushing / Pulling  Pushing is preferable to pulling. Keep back in proper alignment, and use leg muscles to do the work.  Deep Squat   Squat and lift with both arms held against upper trunk. Tighten stomach muscles without holding breath. Use smooth movements to avoid jerking.  Avoid Twisting   Avoid twisting or bending back. Pivot around using foot movements, and bend at knees if needed when reaching for articles.  Carrying Luggage   Distribute weight evenly on both sides. Use a cart whenever possible. Do not twist trunk. Move body as a unit.   Lifting Principles .Maintain proper posture and head alignment. .Slide object as close as possible before lifting. .Move obstacles out of the way. .Test before lifting; ask for help if too heavy. .Tighten stomach muscles without holding breath. .Use smooth movements; do not jerk. .Use legs to do the work, and pivot with feet. .Distribute the work load symmetrically and close to the center of trunk. .Push instead of pull whenever possible.   Ask For Help   Ask for help and delegate to others when possible. Coordinate your movements when lifting together, and maintain the low back curve.  Log Roll   Lying on back, bend left knee and place left arm across chest. Roll all in one movement to the right. Reverse to roll to the left. Always move as one unit. Housework - Sweeping  Use long-handled equipment to avoid stooping.   Housework - Wiping  Position yourself as close as possible to reach work surface. Avoid straining your back.  Laundry - Unloading Wash   To unload small items at bottom of  washer, lift leg opposite to arm being used to reach.  [place] close to area to be raked. Use arm movements to do the work. Keep back straight and avoid twisting.     Cart  When reaching into cart with one arm, lift opposite leg to keep back straight.   Getting Into / Out of Bed  Lower self to lie down on one side by raising legs and lowering head at the same time. Use arms to assist moving without twisting. Bend both knees to roll onto back if desired. To sit up, start from lying on side, and use same move-ments in reverse. Housework - Vacuuming  Hold the vacuum with arm held at side. Step back and forth to move it, keeping head up. Avoid twisting.   Laundry - IT consultant  so that bending and twisting can be avoided.   Laundry - Unloading Dryer  Squat down to reach into clothes dryer or use a reacher.  Gardening - Weeding / Probation officer or Kneel. Knee pads may be helpful.

## 2020-01-27 NOTE — Therapy (Signed)
[address] [place], [place], [address] Phone: [phone]   Fax:  [phone]  Physical Therapy Evaluation  Patient Details  Name: ENVY MENO MRN: [id number] Date of Birth: May 02, 1951 Referring Provider (PT): Dr Kristeen Miss    Encounter Date: 01/26/2020  PT End of Session - 01/27/20 2014    Visit Number  1    Number of Visits  12    Date for PT Re-Evaluation  03/08/20    PT Start Time  1315    PT Stop Time  1407    PT Time Calculation (min)  52 min    Activity Tolerance  Patient tolerated treatment well       Past Medical History:  Diagnosis Date  . Anxiety   . Arthritis   . Osteoporosis   . Sleep apnea 2006   bipap used 2 yrs ago    Past Surgical History:  Procedure Laterality Date  . ABDOMINAL HYSTERECTOMY    . ANKLE SURGERY  2014   right side w pins  . COLONOSCOPY  2011  . [place]   left side  . POLYPECTOMY      There were no vitals filed for this visit.   Subjective Assessment - 01/26/20 1322    Subjective  Patient reports that she has had LBP and mid back pain for the past year with no known injury. Now has buring pain in the center of mid back - symptoms have increased in the past few months. She fell 7 yrs ago sustaining fx of Rt tib/fib and has continued to have pain and problems wiht the ankle since time of fall    Pertinent History  fx Rt tib/fib 7 yrs ago with ORIF; arthritis; osteoporesis    Diagnostic tests  MRI; xray    Patient Stated Goals  help back feel better; learn some exercises    Currently in Pain?  Yes    Pain Score  3     Pain Location  Back    Pain Orientation  Right;Left;Mid;Lower    Pain Descriptors / Indicators  Burning;Aching    Pain Type  Chronic pain    Pain Onset  More than a month ago    Pain Frequency  Intermittent    Aggravating Factors   lifting; reaching; stooping; reaching; sitting > 5 hours    Pain Relieving Factors  meds;  (7.5 mg hydrocodone for ankle) - eases LBP                    Objective measurements completed on examination: See above findings.              PT Education - 01/26/20 1359    Education Details  HEP TENS back care    Person(s) Educated  Patient    Methods  Explanation;Demonstration;Tactile cues;Verbal cues;Handout    Comprehension  Verbalized understanding;Returned demonstration;Verbal cues required;Tactile cues required          PT Long Term Goals - 01/27/20 2018      PT LONG TERM GOAL #1   Title  Improve postural strength with patient to demonstrate improved upright posture and alignment with increased  thoracic extension    Time  6    Period  Weeks    Status  New    Target Date  03/08/20      PT LONG TERM GOAL #2   Title  Decrease pain in the mid and low back  by 25-50% allowing patient to walk and stand for functional distances or time    Time  6    Period  Weeks    Status  New    Target Date  03/08/20      PT LONG TERM GOAL #3   Title  Increase core stability and strength allowing patient to participate in work and home tasks with decreased pain    Time  6    Period  Weeks    Status  New    Target Date  03/08/20      PT LONG TERM GOAL #4   Title  Independent in HEP    Time  6    Period  Weeks    Status  New    Target Date  03/08/20      PT LONG TERM GOAL #5   Title  Improve FOTO to </=  % limitation    Time  6    Period  Weeks    Status  New    Target Date  03/08/20             Plan - 01/27/20 2015    Clinical Impression Statement  Patient presents with 1 year history of mid and low back pain with no known injury. She  has limited spinal mobilty and ROM; decresaed core stability and strenght; pain with palpation; pain with functional activities; decreased endurance for work and home activities. Patient will benefit from PT to address problems identified.    Stability/Clinical Decision Making  Stable/Uncomplicated    Clinical  Decision Making  Low    Rehab Potential  Good    PT Frequency  2x / week    PT Duration  6 weeks    PT Treatment/Interventions  ADLs/Self Care Home Management;Patient/family education;Cryotherapy;Electrical Stimulation;Iontophoresis 4mg /ml Dexamethasone;Moist Heat;Ultrasound;Functional mobility training;Therapeutic activities;Therapeutic exercise;Balance training;Neuromuscular re-education;Manual techniques;Dry needling;Taping    PT Next Visit Plan  review HEP; progress with core stabilization and strengthening; postural education; modalities and manual work as indicated    PT Home Exercise Plan  VHI    Consulted and Agree with Plan of Care  Patient       Patient will benefit from skilled therapeutic intervention in order to improve the following deficits and impairments:  Decreased range of motion, Increased fascial restricitons, Decreased endurance, Decreased activity tolerance, Pain, Decreased balance, Impaired flexibility, Improper body mechanics, Decreased mobility, Decreased strength, Postural dysfunction  Visit Diagnosis: Chronic bilateral low back pain without sciatica - Plan: PT plan of care cert/re-cert  Pain in thoracic spine - Plan: PT plan of care cert/re-cert  Abnormal posture - Plan: PT plan of care cert/re-cert  Muscle weakness (generalized) - Plan: PT plan of care cert/re-cert     Problem List Patient Active Problem List   Diagnosis Date Noted  . Benzodiazepine withdrawal with delirium ([place]) 01/15/2019  . OSA (obstructive sleep apnea) 01/15/2019    Shagun Wordell Nilda Simmer PT, MPH  01/27/2020, 8:25 PM  [facility] [place], [place], [address] Phone: [phone]   Fax:  [phone]  Name: SHANDI GODFREY MRN: [id number] Date of Birth: [birth year]

## 2020-02-09 ENCOUNTER — Other Ambulatory Visit: Payer: Self-pay

## 2020-02-09 ENCOUNTER — Ambulatory Visit (INDEPENDENT_AMBULATORY_CARE_PROVIDER_SITE_OTHER): Payer: Medicare HMO | Admitting: Rehabilitative and Restorative Service Providers"

## 2020-02-09 ENCOUNTER — Encounter: Payer: Self-pay | Admitting: Rehabilitative and Restorative Service Providers"

## 2020-02-09 DIAGNOSIS — M546 Pain in thoracic spine: Secondary | ICD-10-CM

## 2020-02-09 DIAGNOSIS — M545 Low back pain, unspecified: Secondary | ICD-10-CM

## 2020-02-09 DIAGNOSIS — R293 Abnormal posture: Secondary | ICD-10-CM

## 2020-02-09 DIAGNOSIS — G8929 Other chronic pain: Secondary | ICD-10-CM | POA: Diagnosis not present

## 2020-02-09 DIAGNOSIS — M6281 Muscle weakness (generalized): Secondary | ICD-10-CM | POA: Diagnosis not present

## 2020-02-09 NOTE — Patient Instructions (Addendum)
   Using ~ 4 inch plastic ball to massage sore spots - standing at wall   Resisted External Rotation: in Neutral - Bilateral   PALMS UP Sit or stand, tubing in both hands, elbows at sides, bent to 90, forearms forward. Pinch shoulder blades together and rotate forearms out. Keep elbows at sides. Repeat __10__ times per set. Do _2-3___ sets per session. Do _2-3___ sessions per day.   Low Row: Standing   Face anchor, feet shoulder width apart. Palms up, pull arms back, squeezing shoulder blades together. Repeat 10__ times per set. Do 2-3__ sets per session. Do 2-3__ sessions per week. Anchor Height: Waist     Strengthening: Resisted Extension   Hold tubing in right hand, arm forward. Pull arm back, elbow straight. Repeat _10___ times per set. Do 2-3____ sets per session. Do 2-3____ sessions per day.  SIT TO STAND: No Device    Sit with feet shoulder-width apart, on floor. Lean chest forward, raise hips up from surface. Straighten hips and knees. Weight bear equally on left and right sides. __10_ reps per set, _2-3__ sets per day,

## 2020-02-09 NOTE — Therapy (Signed)
[place], [place], [address] Phone: [phone]   Fax:  [phone]  Physical Therapy Treatment  Patient Details  Name: Rebekah Chapman MRN: [id number] Date of Birth: Apr 01, 1951 Referring Provider (PT): Dr Kristeen Miss    Encounter Date: 02/09/2020  PT End of Session - 02/09/20 1109    Visit Number  2    Number of Visits  12    Date for PT Re-Evaluation  03/08/20    PT Start Time  1108   pt arrived 8 min late for appt   PT Stop Time  1147    PT Time Calculation (min)  39 min    Activity Tolerance  Patient tolerated treatment well       Past Medical History:  Diagnosis Date  . Anxiety   . Arthritis   . Osteoporosis   . Sleep apnea 2006   bipap used 2 yrs ago    Past Surgical History:  Procedure Laterality Date  . ABDOMINAL HYSTERECTOMY    . ANKLE SURGERY  2014   right side w pins  . COLONOSCOPY  2011  . [place]   left side  . POLYPECTOMY      There were no vitals filed for this visit.  Subjective Assessment - 02/09/20 1113    Subjective  Patient reports that she has been working on her exercises some at home. Can't find her TENS unit but will borrow one from her sister. Still having pain in the mid back and having a pain in the Rt groin - has been there a while.    Currently in Pain?  Yes    Pain Score  5     Pain Location  Back    Pain Orientation  Right;Left;Mid;Lower    Pain Descriptors / Indicators  Aching;Burning    Pain Type  Chronic pain    Pain Onset  More than a month ago    Pain Frequency  Intermittent                       [facility] Adult PT Treatment/Exercise - 02/09/20 0001      Therapeutic Activites    Therapeutic Activities  --   myofacial ball release standing at wall      Lumbar Exercises: Stretches   Passive Hamstring Stretch  Right;Left;2 reps;30 seconds    Quad Stretch  Right;Left;2 reps;30 seconds    ITB Stretch   Right;Left;2 reps;30 seconds    Piriformis Stretch  Right;Left;2 reps;30 seconds      Lumbar Exercises: Aerobic   Nustep  L5 x 5 min UE 9       Lumbar Exercises: Standing   Scapular Retraction  AROM;Self ROM;Both;10 reps   10 sec hold w/ noodle- VC for head position/dec upper trap     Row  Strengthening;Both;15 reps;Theraband    Theraband Level (Row)  Level 1 (Yellow)    Shoulder Extension  Strengthening;Both;15 reps;Theraband    Theraband Level (Shoulder Extension)  Level 1 (Yellow)      Lumbar Exercises: Seated   Sit to Stand  10 reps      Manual Therapy   Joint Mobilization  gentle joint movement lower thoracic spine; joint movement Rt ankle to boney limits     Soft tissue mobilization  deep tissue work through the psoas and hip flexors               PT Education -  02/09/20 1123    Education Details  HEP myofacial ball release standing    Person(s) Educated  Patient    Methods  Explanation;Demonstration;Tactile cues;Verbal cues;Handout    Comprehension  Verbalized understanding;Returned demonstration;Verbal cues required;Tactile cues required          PT Long Term Goals - 01/27/20 2018      PT LONG TERM GOAL #1   Title  Improve postural strength with patient to demonstrate improved upright posture and alignment with increased  thoracic extension    Time  6    Period  Weeks    Status  New    Target Date  03/08/20      PT LONG TERM GOAL #2   Title  Decrease pain in the mid and low back by 25-50% allowing patient to walk and stand for functional distances or time    Time  6    Period  Weeks    Status  New    Target Date  03/08/20      PT LONG TERM GOAL #3   Title  Increase core stability and strength allowing patient to participate in work and home tasks with decreased pain    Time  6    Period  Weeks    Status  New    Target Date  03/08/20      PT LONG TERM GOAL #4   Title  Independent in HEP    Time  6    Period  Weeks    Status  New    Target  Date  03/08/20      PT LONG TERM GOAL #5   Title  Improve FOTO to </=  45% limitation    Time  6    Period  Weeks    Status  New    Target Date  03/08/20            Plan - 02/09/20 1121    Clinical Impression Statement  Patient reports compliance with HEP. Added exercises with theraband today. Manual work through the Rt psoas and thoracic spine - may benefit from mobilization for Rt ankle - very tight lower thoracic spine    Rehab Potential  Good    PT Frequency  2x / week    PT Duration  6 weeks    PT Treatment/Interventions  ADLs/Self Care Home Management;Patient/family education;Cryotherapy;Electrical Stimulation;Iontophoresis 4mg /ml Dexamethasone;Moist Heat;Ultrasound;Functional mobility training;Therapeutic activities;Therapeutic exercise;Balance training;Neuromuscular re-education;Manual techniques;Dry needling;Taping    PT Next Visit Plan  review HEP; progress with core stabilization and strengthening; postural education; modalities and manual work as indicated - trial of ankle mobs - manual work through the thoracic spine    PT [place] and Agree with Plan of Care  Patient       Patient will benefit from skilled therapeutic intervention in order to improve the following deficits and impairments:     Visit Diagnosis: Chronic bilateral low back pain without sciatica  Pain in thoracic spine  Abnormal posture  Muscle weakness (generalized)     Problem List Patient Active Problem List   Diagnosis Date Noted  . Benzodiazepine withdrawal with delirium ([place]) 01/15/2019  . OSA (obstructive sleep apnea) 01/15/2019    [name] PT, MPH  02/09/2020, 12:56 PM  [facility] [place], [place], [address] Phone: [phone]   Fax:  [phone]  Name: Rebekah Chapman MRN: [id number] Date of Birth: 1951/03/19

## 2020-02-16 ENCOUNTER — Encounter: Payer: Self-pay | Admitting: Rehabilitative and Restorative Service Providers"

## 2020-02-16 ENCOUNTER — Ambulatory Visit (INDEPENDENT_AMBULATORY_CARE_PROVIDER_SITE_OTHER): Payer: Medicare HMO | Admitting: Rehabilitative and Restorative Service Providers"

## 2020-02-16 ENCOUNTER — Other Ambulatory Visit: Payer: Self-pay

## 2020-02-16 DIAGNOSIS — M6281 Muscle weakness (generalized): Secondary | ICD-10-CM | POA: Diagnosis not present

## 2020-02-16 DIAGNOSIS — G479 Sleep disorder, unspecified: Secondary | ICD-10-CM | POA: Diagnosis not present

## 2020-02-16 DIAGNOSIS — G8929 Other chronic pain: Secondary | ICD-10-CM | POA: Diagnosis not present

## 2020-02-16 DIAGNOSIS — R293 Abnormal posture: Secondary | ICD-10-CM

## 2020-02-16 DIAGNOSIS — M545 Low back pain, unspecified: Secondary | ICD-10-CM

## 2020-02-16 DIAGNOSIS — G5790 Unspecified mononeuropathy of unspecified lower limb: Secondary | ICD-10-CM | POA: Diagnosis not present

## 2020-02-16 DIAGNOSIS — M546 Pain in thoracic spine: Secondary | ICD-10-CM | POA: Diagnosis not present

## 2020-02-16 NOTE — Patient Instructions (Signed)
Hip Extension (Standing)    Stand with support.  Tighten core and hold. Move right leg backward with straight knee, rest toe on floor and straighten knee.  Hold for __15-20_ seconds. Relax for _2-3__ seconds. Repeat __5_ times. Do _2-3__ times a day. Repeat with other leg.

## 2020-02-16 NOTE — Therapy (Signed)
[place], [place], [address] Phone: [phone]   Fax:  [phone]  Physical Therapy Treatment  Patient Details  Name: Rebekah Chapman MRN: [id number] Date of Birth: December 07, 1950 Referring Provider (PT): Dr Kristeen Miss    Encounter Date: 02/16/2020  PT End of Session - 02/16/20 1117    Visit Number  3    Number of Visits  12    Date for PT Re-Evaluation  03/08/20    PT Start Time  1112    PT Stop Time  1155   [place] x 10 min   PT Time Calculation (min)  43 min    Activity Tolerance  Patient tolerated treatment well       Past Medical History:  Diagnosis Date  . Anxiety   . Arthritis   . Osteoporosis   . Sleep apnea 2006   bipap used 2 yrs ago    Past Surgical History:  Procedure Laterality Date  . ABDOMINAL HYSTERECTOMY    . ANKLE SURGERY  2014   right side w pins  . COLONOSCOPY  2011  . [place]   left side  . POLYPECTOMY      There were no vitals filed for this visit.  Subjective Assessment - 02/16/20 1118    Subjective  Patient reports that she continues to have pain but feels that therapy is helping. Her husband is helping to remind her to stay straight.    Currently in Pain?  Yes    Pain Score  5     Pain Location  Back    Pain Descriptors / Indicators  Aching;Burning    Pain Type  Chronic pain    Pain Onset  More than a month ago    Pain Frequency  Intermittent         [facility] PT Assessment - 02/16/20 0001      Assessment   Medical Diagnosis  LBP; dorsalgia     Referring Provider (PT)  Dr Kristeen Miss     Onset Date/Surgical Date  01/01/20    Hand Dominance  Right    Next MD Visit  PRN     Prior Therapy  none       Sensation   Additional Comments  no significant tingling noted       Posture/Postural Control   Posture Comments  improving posture and alignment with more upright posture       Flexibility   Soft Tissue Assessment /Muscle Length  --    improving mobility/flexibility through LE's     Palpation   Palpation comment  muscular tightness through the thoracolumbar paraspinals and bilat lumbar musculature; psoas musculature                    [facility] Adult PT Treatment/Exercise - 02/16/20 0001      Lumbar Exercises: Stretches   Passive Hamstring Stretch  Right;Left;2 reps;30 seconds    Quad Stretch  Right;Left;2 reps;30 seconds    ITB Stretch  Right;Left;2 reps;30 seconds    Piriformis Stretch  Right;Left;2 reps;30 seconds      Lumbar Exercises: Aerobic   Nustep  L5 x 4 min UE 9       Lumbar Exercises: Standing   Scapular Retraction  AROM;Self ROM;Both;10 reps   10 sec hold w/ noodle- L's and W's     Row  Strengthening;Both;15 reps;Theraband    Theraband Level (Row)  Level 1 (Yellow)  Shoulder Extension  Strengthening;Both;15 reps;Theraband    Theraband Level (Shoulder Extension)  Level 1 (Yellow)    Other Standing Lumbar Exercises  hip extension resting toe on floor to stretch hip flexors 15 sec x 5 reps       Lumbar Exercises: Seated   Sit to Stand  10 reps      Moist Heat Therapy   Number Minutes Moist Heat  10 Minutes    Moist Heat Location  Ankle   Rt      Manual Therapy   Joint Mobilization  jont mobs Rt ankle Grade I/II     Soft tissue mobilization  deep tissue work through the psoas and hip flexors               PT Education - 02/16/20 1128    Education Details  HEP    Person(s) Educated  Patient    Methods  Explanation;Demonstration;Tactile cues;Verbal cues;Handout    Comprehension  Verbalized understanding;Returned demonstration;Verbal cues required;Tactile cues required          PT Long Term Goals - 01/27/20 2018      PT LONG TERM GOAL #1   Title  Improve postural strength with patient to demonstrate improved upright posture and alignment with increased  thoracic extension    Time  6    Period  Weeks    Status  New    Target Date  03/08/20      PT LONG TERM GOAL #2    Title  Decrease pain in the mid and low back by 25-50% allowing patient to walk and stand for functional distances or time    Time  6    Period  Weeks    Status  New    Target Date  03/08/20      PT LONG TERM GOAL #3   Title  Increase core stability and strength allowing patient to participate in work and home tasks with decreased pain    Time  6    Period  Weeks    Status  New    Target Date  03/08/20      PT LONG TERM GOAL #4   Title  Independent in HEP    Time  6    Period  Weeks    Status  New    Target Date  03/08/20      PT LONG TERM GOAL #5   Title  Improve FOTO to </=  45% limitation    Time  6    Period  Weeks    Status  New    Target Date  03/08/20            Plan - 02/16/20 1125    Clinical Impression Statement  Continued improvement in symptoms with decreased pain and improved function. Patient has decreased tightness and pain with palpation through bilat hip flexors/psoas with improved upright posture. Added gentle stretch for hip flexors in standing. Trial of Rt ankle mobs/PROM/stretching to tissue limits. Patient will benefit from continued treatment to achieve goals and reach maximum rehab potential.    Rehab Potential  Good    PT Frequency  2x / week    PT Duration  6 weeks    PT Treatment/Interventions  ADLs/Self Care Home Management;Patient/family education;Cryotherapy;Electrical Stimulation;Iontophoresis 4mg /ml Dexamethasone;Moist Heat;Ultrasound;Functional mobility training;Therapeutic activities;Therapeutic exercise;Balance training;Neuromuscular re-education;Manual techniques;Dry needling;Taping    PT Next Visit Plan  review HEP; progress with core stabilization and strengthening; postural education; modalities and manual work as indicated - assess  response to trial of ankle mobs - trial of manual work through the thoracic spine - add stretch into lateral trunk flexion sitting, add counter plank for core stabilization; add gastroc/soleus stretches     PT Home Exercise Plan  VHI    Consulted and Agree with Plan of Care  Patient       Patient will benefit from skilled therapeutic intervention in order to improve the following deficits and impairments:     Visit Diagnosis: Chronic bilateral low back pain without sciatica  Pain in thoracic spine  Abnormal posture  Muscle weakness (generalized)     Problem List Patient Active Problem List   Diagnosis Date Noted  . Benzodiazepine withdrawal with delirium ([place]) 01/15/2019  . OSA (obstructive sleep apnea) 01/15/2019    Taysean Wager Nilda Simmer PT, MPH  02/16/2020, 12:08 PM  [facility] [place], [place], [address] Phone: [phone]   Fax:  [phone]  Name: Rebekah Chapman MRN: [id number] Date of Birth: January 26, 1951

## 2020-02-23 ENCOUNTER — Ambulatory Visit (INDEPENDENT_AMBULATORY_CARE_PROVIDER_SITE_OTHER): Payer: Medicare HMO | Admitting: Rehabilitative and Restorative Service Providers"

## 2020-02-23 ENCOUNTER — Other Ambulatory Visit: Payer: Self-pay

## 2020-02-23 ENCOUNTER — Encounter: Payer: Self-pay | Admitting: Rehabilitative and Restorative Service Providers"

## 2020-02-23 DIAGNOSIS — G8929 Other chronic pain: Secondary | ICD-10-CM | POA: Diagnosis not present

## 2020-02-23 DIAGNOSIS — M546 Pain in thoracic spine: Secondary | ICD-10-CM | POA: Diagnosis not present

## 2020-02-23 DIAGNOSIS — M6281 Muscle weakness (generalized): Secondary | ICD-10-CM

## 2020-02-23 DIAGNOSIS — M545 Low back pain, unspecified: Secondary | ICD-10-CM

## 2020-02-23 DIAGNOSIS — R293 Abnormal posture: Secondary | ICD-10-CM

## 2020-02-23 NOTE — Patient Instructions (Addendum)
  Scapular Retraction (Standing)   Back to wall  With arms at sides, pinch shoulder blades together - push hips away from the wall  Hold 10 -15 sec Repeat __5 __ times per set. Do _2-3___ sessions per day.    Achilles / Gastroc, Standing    Stand, right foot behind, heel on floor and turned slightly out, leg straight, forward leg bent. Move hips forward. Hold _20-30__ seconds. Repeat _3__ times per session. Do _2-3__ sessions per day.   Achilles / Soleus, Standing    Stand, right foot behind, heel on floor and turned slightly out. Lower hips and bend knees. Hold 20-30_ seconds. Repeat __3_ times per session. Do _2-3__ sessions per day.  Cat / Cow Flow    Inhale, press spine toward ceiling like a Halloween cat. Keeping strength in arms and abdominals, exhale to soften spine through neutral and into cow pose. Open chest and arch back. Initiate movement between cat and cow at tailbone, one vertebrae at a time. Hold 10 sec Repeat __5-10__ times.

## 2020-02-23 NOTE — Therapy (Signed)
[place], [place], [address] Phone: [phone]   Fax:  [phone]  Physical Therapy Treatment  Patient Details  Name: Rebekah Chapman MRN: [id number] Date of Birth: [birth year] Referring Provider (PT): Dr Kristeen Miss    Encounter Date: 02/23/2020  PT End of Session - 02/23/20 1105    Visit Number  4    Number of Visits  12    Date for PT Re-Evaluation  03/08/20    PT Start Time  1104    PT Stop Time  1152    PT Time Calculation (min)  48 min    Activity Tolerance  Patient tolerated treatment well       Past Medical History:  Diagnosis Date  . Anxiety   . Arthritis   . Osteoporosis   . Sleep apnea 2006   bipap used 2 yrs ago    Past Surgical History:  Procedure Laterality Date  . ABDOMINAL HYSTERECTOMY    . ANKLE SURGERY  2014   right side w pins  . COLONOSCOPY  2011  . [place]   left side  . POLYPECTOMY      There were no vitals filed for this visit.  Subjective Assessment - 02/23/20 1106    Subjective  Patient reports that her ankle hurt all weekend after last treatment. She feels that the ankle is moving better and she is walking better. Lost her exercise handout.    Currently in Pain?  Yes    Pain Score  5     Pain Location  Back    Pain Orientation  Right;Left;Mid;Lower    Pain Descriptors / Indicators  Aching;Burning    Pain Type  Chronic pain    Pain Onset  More than a month ago    Pain Frequency  Intermittent         [facility] PT Assessment - 02/23/20 0001      Assessment   Medical Diagnosis  LBP; dorsalgia     Referring Provider (PT)  Dr Kristeen Miss     Onset Date/Surgical Date  01/01/20    Hand Dominance  Right    Next MD Visit  PRN     Prior Therapy  none       Posture/Postural Control   Posture Comments  improving posture and alignment with more upright posture       AROM   Overall AROM Comments  increased Rt ankle mobility/ROM       Palpation   Palpation comment  muscular tightness through the thoracolumbar paraspinals and bilat lumbar musculature; psoas musculature                    [facility] Adult PT Treatment/Exercise - 02/23/20 0001      Lumbar Exercises: Stretches   Passive Hamstring Stretch  Right;Left;2 reps;30 seconds    Quad Stretch  Right;Left;2 reps;30 seconds    ITB Stretch  Right;Left;2 reps;30 seconds    Piriformis Stretch  Right;Left;2 reps;30 seconds    Gastroc Stretch  Right;3 reps;20 seconds   to tolerance   Other Lumbar Stretch Exercise  hip flexor stretch seated 20 sec x 3 each LE       Lumbar Exercises: Aerobic   Nustep  L5 x 4 min UE 9       Lumbar Exercises: Standing   Scapular Retraction  AROM;Self ROM;Both;10 reps    Theraband Level (Scapular Retraction)  Level 2 (Red)  Row  Strengthening;Both;15 reps;Theraband    Theraband Level (Row)  Level 2 (Red)    Shoulder Extension  Strengthening;Both;15 reps;Theraband    Theraband Level (Shoulder Extension)  Level 2 (Red)    Other Standing Lumbar Exercises  hip extension resting toe on floor to stretch hip flexors 20 sec x 5 reps     Other Standing Lumbar Exercises  back to wall pushing hips away from wall hold 10 sec x 5 reps       Lumbar Exercises: Seated   Sit to Stand  10 reps      Lumbar Exercises: Quadruped   [place]  10 reps    [place]/Old Horse Limitations  verbal and tactile cues for form       Moist Heat Therapy   Number Minutes Moist Heat  10 Minutes    Moist Heat Location  Ankle   Rt - abdominal      Manual Therapy   Joint Mobilization  jont mobs Rt ankle Grade I/II     Soft tissue mobilization  deep tissue work through the psoas and hip flexors               PT Education - 02/23/20 1122    Education Details  HEP    Person(s) Educated  Patient    Methods  Explanation;Demonstration;Tactile cues;Verbal cues;Handout    Comprehension  Verbalized understanding;Returned demonstration;Verbal cues  required;Tactile cues required          PT Long Term Goals - 01/27/20 2018      PT LONG TERM GOAL #1   Title  Improve postural strength with patient to demonstrate improved upright posture and alignment with increased  thoracic extension    Time  6    Period  Weeks    Status  New    Target Date  03/08/20      PT LONG TERM GOAL #2   Title  Decrease pain in the mid and low back by 25-50% allowing patient to walk and stand for functional distances or time    Time  6    Period  Weeks    Status  New    Target Date  03/08/20      PT LONG TERM GOAL #3   Title  Increase core stability and strength allowing patient to participate in work and home tasks with decreased pain    Time  6    Period  Weeks    Status  New    Target Date  03/08/20      PT LONG TERM GOAL #4   Title  Independent in HEP    Time  6    Period  Weeks    Status  New    Target Date  03/08/20      PT LONG TERM GOAL #5   Title  Improve FOTO to </=  45% limitation    Time  6    Period  Weeks    Status  New    Target Date  03/08/20            Plan - 02/23/20 1152    Clinical Impression Statement  Ankle sore from last visit but felst better after the soreness subsided. Continued with core stabilization and strengthening as well as ROM exercises for Rt ankle. Patient continues to demonstrate increase exercise and ADL tolerance with decreased LBP. Progressing gradually toward stated goals of therapy.    PT Frequency  2x / week    PT  Duration  6 weeks    PT Treatment/Interventions  ADLs/Self Care Home Management;Patient/family education;Cryotherapy;Electrical Stimulation;Iontophoresis 4mg /ml Dexamethasone;Moist Heat;Ultrasound;Functional mobility training;Therapeutic activities;Therapeutic exercise;Balance training;Neuromuscular re-education;Manual techniques;Dry needling;Taping    PT Next Visit Plan  review HEP; progress with core stabilization and strengthening; postural education; modalities and manual work  as indicated - trial of manual work through the thoracic spine - add stretch into lateral trunk flexion sitting, add counter plank for core stabilization    Consulted and Agree with Plan of Care  Patient       Patient will benefit from skilled therapeutic intervention in order to improve the following deficits and impairments:     Visit Diagnosis: Chronic bilateral low back pain without sciatica  Pain in thoracic spine  Abnormal posture  Muscle weakness (generalized)     Problem List Patient Active Problem List   Diagnosis Date Noted  . Benzodiazepine withdrawal with delirium ([place]) 01/15/2019  . OSA (obstructive sleep apnea) 01/15/2019    Tamrah Victorino P Darci Lykins 02/23/2020, 11:58 AM  [facility] [place], [place], [address] Phone: [phone]   Fax:  [phone]  Name: Rebekah Chapman MRN: [id number] Date of Birth: 26-Nov-1950

## 2020-03-01 ENCOUNTER — Other Ambulatory Visit: Payer: Self-pay

## 2020-03-01 ENCOUNTER — Encounter: Payer: Self-pay | Admitting: Rehabilitative and Restorative Service Providers"

## 2020-03-01 ENCOUNTER — Ambulatory Visit (INDEPENDENT_AMBULATORY_CARE_PROVIDER_SITE_OTHER): Payer: Medicare HMO | Admitting: Rehabilitative and Restorative Service Providers"

## 2020-03-01 DIAGNOSIS — M546 Pain in thoracic spine: Secondary | ICD-10-CM | POA: Diagnosis not present

## 2020-03-01 DIAGNOSIS — M545 Low back pain, unspecified: Secondary | ICD-10-CM

## 2020-03-01 DIAGNOSIS — M6281 Muscle weakness (generalized): Secondary | ICD-10-CM | POA: Diagnosis not present

## 2020-03-01 DIAGNOSIS — G8929 Other chronic pain: Secondary | ICD-10-CM

## 2020-03-01 DIAGNOSIS — R293 Abnormal posture: Secondary | ICD-10-CM | POA: Diagnosis not present

## 2020-03-01 NOTE — Patient Instructions (Signed)
Wall Push-Up    With feet and hands shoulder-width apart, lean into wall, then push away from wall. Repeat _10 ___ times 1 - 2 sets  Do __1__ sessions per day.   Wall Shoulder Press-Out    With palms flat on wall, shoulder-width apart, and elbows straight, hold core tight. Hold 1-2 minutes Repeat __2-3 __ times  Do __1__ sessions per day.    FLEXION: Sitting - Resistance Band (Active)   Wrap band around book  Sit with right foot flat. Against red resistance band, bend ankle, bringing toes toward head. Complete _1-2__ sets of __5-10_ repetitions. Perform __1_ sessions per day. Ankle Dorsiflexion: Long-Sitting (Single Leg)    Sit facing anchor, tubing around forefoot, pull toes back toward head. Repeat 5-10__ times per set. Repeat with other leg. Do 1-2_ sets per session. Do _1_ sessions per day Anchor Height: Ankle (when standing)   Plantar Flexion: Resisted   Green band  Anchor behind, tubing around left foot, press down. Repeat __5-10__ times per set. Do _1-2___ sets per session. Do __1__ sessions per day.   Wrap stretchy band around the ankle and pull ankle up and out keeping leg still  5-10 reps 1-2 sets 1 times/per   Wrap stretchy band around ankle and pull ankle up and in keeping leg still  5-10 reps 1-2 sets 1 times/per

## 2020-03-01 NOTE — Therapy (Signed)
[place], [place], [address] Phone: [phone]   Fax:  [phone]  Physical Therapy Treatment  Patient Details  Name: Rebekah Chapman MRN: [id number] Date of Birth: 1951-01-17 Referring Provider (PT): Dr Kristeen Miss    Encounter Date: 03/01/2020  PT End of Session - 03/01/20 1146    Visit Number  5    Number of Visits  12    Date for PT Re-Evaluation  03/08/20    PT Start Time  3762    PT Stop Time  1234   moist heat end of treatment   PT Time Calculation (min)  49 min       Past Medical History:  Diagnosis Date  . Anxiety   . Arthritis   . Osteoporosis   . Sleep apnea 2006   bipap used 2 yrs ago    Past Surgical History:  Procedure Laterality Date  . ABDOMINAL HYSTERECTOMY    . ANKLE SURGERY  2014   right side w pins  . COLONOSCOPY  2011  . [place]   left side  . POLYPECTOMY      There were no vitals filed for this visit.  Subjective Assessment - 03/01/20 1147    Subjective  ack and ankle are feeling better. She has LBP at times - burning sensation more toward the end of the week    Currently in Pain?  No/denies    Pain Location  Back                       [facility] Adult PT Treatment/Exercise - 03/01/20 0001      Lumbar Exercises: Aerobic   Nustep  L6 x 5 min UE 9       Lumbar Exercises: Standing   Wall Slides Limitations  wall plank 2 min x 2; wall push up x 10     Other Standing Lumbar Exercises  hip extension resting toe on floor to stretch hip flexors 20 sec x 5 reps     Other Standing Lumbar Exercises  back to wall pushing hips away from wall hold 10 sec x 5 reps       Lumbar Exercises: Seated   Sit to Stand  10 reps      Manual Therapy   Joint Mobilization  jont mobs Rt ankle Grade I/II     Passive ROM  Rt ankle in all planes       Ankle Exercises: Seated   Other Seated Ankle Exercises  4 way TB exercises 5 reps each direction  red TB DF/inv/eve; green TB PF              PT Education - 03/01/20 1228    Education Details  HEP    Person(s) Educated  Patient    Methods  Explanation;Demonstration;Tactile cues;Verbal cues;Handout    Comprehension  Verbalized understanding;Returned demonstration;Verbal cues required;Tactile cues required          PT Long Term Goals - 01/27/20 2018      PT LONG TERM GOAL #1   Title  Improve postural strength with patient to demonstrate improved upright posture and alignment with increased  thoracic extension    Time  6    Period  Weeks    Status  New    Target Date  03/08/20      PT LONG TERM GOAL #2   Title  Decrease pain in the mid and low  back by 25-50% allowing patient to walk and stand for functional distances or time    Time  6    Period  Weeks    Status  New    Target Date  03/08/20      PT LONG TERM GOAL #3   Title  Increase core stability and strength allowing patient to participate in work and home tasks with decreased pain    Time  6    Period  Weeks    Status  New    Target Date  03/08/20      PT LONG TERM GOAL #4   Title  Independent in HEP    Time  6    Period  Weeks    Status  New    Target Date  03/08/20      PT LONG TERM GOAL #5   Title  Improve FOTO to </=  45% limitation    Time  6    Period  Weeks    Status  New    Target Date  03/08/20            Plan - 03/01/20 1150    Clinical Impression Statement  Less LBP and ankle is feeling looser - not as sore after the last PT visit as it was the one before. Working on her exercises at home. Please with progress. Excellent progress with good tolerance of increased core stabilization. Note significant increase in ankle mobility/ROM. Added light resistive exercises for ankle in all planes without difficulty.    Rehab Potential  Good    PT Frequency  2x / week    PT Duration  6 weeks    PT Treatment/Interventions  ADLs/Self Care Home Management;Patient/family  education;Cryotherapy;Electrical Stimulation;Iontophoresis 4mg /ml Dexamethasone;Moist Heat;Ultrasound;Functional mobility training;Therapeutic activities;Therapeutic exercise;Balance training;Neuromuscular re-education;Manual techniques;Dry needling;Taping    PT Next Visit Plan  review HEP; progress with core stabilization and strengthening; postural education; modalities and manual work as indicated - trial of manual work through the thoracic spine as needed  - add stretch into lateral trunk flexion sitting, add counter plank for core stabilization as ankle allows- progress TB for ankle - add towel crunch/marble pick up - evaluate for discharge to independent HEP    PT Home Exercise Plan  VHI    Consulted and Agree with Plan of Care  Patient       Patient will benefit from skilled therapeutic intervention in order to improve the following deficits and impairments:     Visit Diagnosis: Chronic bilateral low back pain without sciatica  Pain in thoracic spine  Abnormal posture  Muscle weakness (generalized)     Problem List Patient Active Problem List   Diagnosis Date Noted  . Benzodiazepine withdrawal with delirium ([place]) 01/15/2019  . OSA (obstructive sleep apnea) 01/15/2019    Rebekah Chapman PT, MPH  03/01/2020, 12:35 PM  [facility] [place], [place], [address] Phone: [phone]   Fax:  [phone]  Name: Rebekah Chapman MRN: [id number] Date of Birth: Mar 09, 1951

## 2020-03-08 ENCOUNTER — Other Ambulatory Visit: Payer: Self-pay

## 2020-03-08 ENCOUNTER — Encounter: Payer: Self-pay | Admitting: Rehabilitative and Restorative Service Providers"

## 2020-03-08 ENCOUNTER — Ambulatory Visit (INDEPENDENT_AMBULATORY_CARE_PROVIDER_SITE_OTHER): Payer: Medicare HMO | Admitting: Rehabilitative and Restorative Service Providers"

## 2020-03-08 DIAGNOSIS — M6281 Muscle weakness (generalized): Secondary | ICD-10-CM | POA: Diagnosis not present

## 2020-03-08 DIAGNOSIS — M545 Low back pain, unspecified: Secondary | ICD-10-CM

## 2020-03-08 DIAGNOSIS — M546 Pain in thoracic spine: Secondary | ICD-10-CM | POA: Diagnosis not present

## 2020-03-08 DIAGNOSIS — G8929 Other chronic pain: Secondary | ICD-10-CM

## 2020-03-08 DIAGNOSIS — R293 Abnormal posture: Secondary | ICD-10-CM | POA: Diagnosis not present

## 2020-03-08 NOTE — Therapy (Addendum)
Rebekah Chapman, [place], [address] Phone: [phone]   Fax:  [phone]  Physical Therapy Treatment  Patient Details  Name: Rebekah Chapman MRN: [id number] Date of Birth: 12/18/1950 Referring Provider (PT): Dr Kristeen Miss    Encounter Date: 03/08/2020  PT End of Session - 03/08/20 1149    Visit Number  6    Number of Visits  12    Date for PT Re-Evaluation  03/08/20    PT Start Time  8242    PT Stop Time  1230   moist heat end of treatment   PT Time Calculation (min)  42 min       Past Medical History:  Diagnosis Date  . Anxiety   . Arthritis   . Osteoporosis   . Sleep apnea 2006   bipap used 2 yrs ago    Past Surgical History:  Procedure Laterality Date  . ABDOMINAL HYSTERECTOMY    . ANKLE SURGERY  2014   right side w pins  . COLONOSCOPY  2011  . [place]   left side  . POLYPECTOMY      There were no vitals filed for this visit.  Subjective Assessment - 03/08/20 1155    Subjective  Patient reports that her back is doing well. She has some pain in the Rt ankle today from doing so much walking before her appointment.    Currently in Pain?  No/denies         [facility] PT Assessment - 03/08/20 0001      Assessment   Medical Diagnosis  LBP; dorsalgia     Referring Provider (PT)  Dr Kristeen Miss     Onset Date/Surgical Date  01/01/20    Hand Dominance  Right    Next MD Visit  PRN     Prior Therapy  none       Observation/Other Assessments   Focus on Therapeutic Outcomes (FOTO)   39% limitation       AROM   Overall AROM Comments  increased Rt ankle mobility/ROM     Lumbar Flexion  WNL's no pain     Lumbar Extension  65% no pain     Lumbar - Right Side Bend  70% pulling in the Lt LB    Lumbar - Left Side Bend  75% pulling no pain in the Lt LB     Lumbar - Right Rotation  50% no pain     Lumbar - Left Rotation  50% no pain       Strength   Overall Strength  Comments  LE's strength - functional strength - not tested resistively       Flexibility   Hamstrings  improved flexibility     Quadriceps  WFL's     ITB  WFL's     Piriformis  tight RT       Palpation   Palpation comment  improved muscular tightness through the thoracolumbar paraspinals and bilat lumbar musculature; psoas musculature                    [facility] Adult PT Treatment/Exercise - 03/08/20 0001      Lumbar Exercises: Stretches   Passive Hamstring Stretch  Right;Left;2 reps;30 seconds    Standing Extension  2 reps   2-3 sec    Quad Stretch  Right;Left;2 reps;30 seconds    Piriformis Stretch  Right;Left;2 reps;30 seconds      Lumbar Exercises:  Aerobic   Nustep  L6 x 5 min UE 9       Lumbar Exercises: Standing   Wall Slides Limitations  wall plank 2 min x 2; wall push up x 10     Other Standing Lumbar Exercises  hip extension resting toe on floor to stretch hip flexors 20 sec x 5 reps     Other Standing Lumbar Exercises  back to wall pushing hips away from wall hold 10 sec x 5 reps       Lumbar Exercises: Seated   Sit to Stand  10 reps    Other Seated Lumbar Exercises  lateral trunk flexion over bolster 30 sec x 2 reps each side       Moist Heat Therapy   Number Minutes Moist Heat  10 Minutes    Moist Heat Location  Ankle;Lumbar Spine      Manual Therapy   Joint Mobilization  jont mobs Rt ankle Grade I/II     Passive ROM  Rt ankle in all planes       Ankle Exercises: Seated   ABC's  1 rep    Ankle Circles/Pumps  AROM;Right;10 reps    Towel Crunch  5 reps    Marble Pickup  1-2 min              PT Education - 03/08/20 1205    Education Details  HEP    Person(s) Educated  Patient    Methods  Explanation;Demonstration;Tactile cues;Verbal cues;Handout    Comprehension  Verbalized understanding;Returned demonstration;Verbal cues required;Tactile cues required          PT Long Term Goals - 03/08/20 1231      PT LONG TERM GOAL #1   Title   Improve postural strength with patient to demonstrate improved upright posture and alignment with increased  thoracic extension    Time  6    Period  Weeks    Status  Achieved      PT LONG TERM GOAL #2   Title  Decrease pain in the mid and low back by 25-50% allowing patient to walk and stand for functional distances or time    Time  6    Period  Weeks    Status  Achieved      PT LONG TERM GOAL #3   Title  Increase core stability and strength allowing patient to participate in work and home tasks with decreased pain    Time  6    Period  Weeks    Status  Achieved      PT LONG TERM GOAL #4   Title  Independent in HEP    Time  6    Period  Weeks    Status  Achieved      PT LONG TERM GOAL #5   Title  Improve FOTO to </=  45% limitation    Time  6    Period  Weeks    Status  Achieved            Plan - 03/08/20 1228    Clinical Impression Statement  Patient reports improved LBP and ankle mobility resulting in improved gait pattern and endurance with decreased pain. Patient demonstrates increased trunk mobility and ROM with minimal to no pain in most ranges. She is independent in HEP and feels confident in continuing independently. Patient will call with any questions or problems.    Rehab Potential  Good    PT Frequency  2x / week  PT Duration  6 weeks    PT Treatment/Interventions  ADLs/Self Care Home Management;Patient/family education;Cryotherapy;Electrical Stimulation;Iontophoresis 61m/ml Dexamethasone;Moist Heat;Ultrasound;Functional mobility training;Therapeutic activities;Therapeutic exercise;Balance training;Neuromuscular re-education;Manual techniques;Dry needling;Taping    PT Next Visit Plan  place pt on hold - she will call with any questions or problems    PT Home Exercise Plan  VHI       Patient will benefit from skilled therapeutic intervention in order to improve the following deficits and impairments:     Visit Diagnosis: Chronic bilateral low back pain  without sciatica  Pain in thoracic spine  Abnormal posture  Muscle weakness (generalized)     Problem List Patient Active Problem List   Diagnosis Date Noted  . [place] 01/15/[place]  . OSA (obstructive sleep apnea) 01/15/2019    Rebekah Chapman PNilda SimmerPT, MPH  03/08/2020, 12:34 PM  [facility] [place] [address]Phone: [phone]  Fax:  [phone] Name: Rebekah KLUNKMRN: [id number] of Birth: 512-07-1951 PHYSICAL THERAPY DISCHARGE SUMMARY  Visits from Start of Care: 6  Current functional level related to goals / functional outcomes: See progress note for discharge status    Remaining deficits: Pain free at discharge - doing well with return to all functional activities - needs to continue with daily HEP    Education / Equipment: HEP   Plan: Patient agrees to discharge.  Patient goals were met. Patient is being discharged due to meeting the stated rehab goals.  ?????      Maven Varelas P. HHelene KelpPT, MPH 05/03/20 11:02 AM

## 2020-03-08 NOTE — Patient Instructions (Addendum)
  Towel crunch with toes    Marble pick with toes    Sit at edge of bed fold pillows to place at side and lean over the pillows  Bring arm over head like a ballerina  Hold 30 sec x 3 reps each side

## 2020-03-15 ENCOUNTER — Encounter: Payer: Medicare HMO | Admitting: Rehabilitative and Restorative Service Providers"

## 2020-04-05 DIAGNOSIS — G4733 Obstructive sleep apnea (adult) (pediatric): Secondary | ICD-10-CM | POA: Diagnosis not present

## 2020-05-10 DIAGNOSIS — G8929 Other chronic pain: Secondary | ICD-10-CM | POA: Diagnosis not present

## 2020-05-10 DIAGNOSIS — G479 Sleep disorder, unspecified: Secondary | ICD-10-CM | POA: Diagnosis not present

## 2020-05-24 DIAGNOSIS — J3089 Other allergic rhinitis: Secondary | ICD-10-CM | POA: Diagnosis not present

## 2020-05-24 DIAGNOSIS — F17210 Nicotine dependence, cigarettes, uncomplicated: Secondary | ICD-10-CM | POA: Diagnosis not present

## 2020-05-24 DIAGNOSIS — G4733 Obstructive sleep apnea (adult) (pediatric): Secondary | ICD-10-CM | POA: Diagnosis not present

## 2020-06-14 DIAGNOSIS — F17210 Nicotine dependence, cigarettes, uncomplicated: Secondary | ICD-10-CM | POA: Diagnosis not present

## 2020-07-08 DIAGNOSIS — G4733 Obstructive sleep apnea (adult) (pediatric): Secondary | ICD-10-CM | POA: Diagnosis not present

## 2020-07-19 DIAGNOSIS — M810 Age-related osteoporosis without current pathological fracture: Secondary | ICD-10-CM | POA: Diagnosis not present

## 2020-07-19 DIAGNOSIS — E782 Mixed hyperlipidemia: Secondary | ICD-10-CM | POA: Diagnosis not present

## 2020-07-19 DIAGNOSIS — G8929 Other chronic pain: Secondary | ICD-10-CM | POA: Diagnosis not present

## 2020-08-09 DIAGNOSIS — E782 Mixed hyperlipidemia: Secondary | ICD-10-CM | POA: Diagnosis not present

## 2020-08-09 DIAGNOSIS — Z79899 Other long term (current) drug therapy: Secondary | ICD-10-CM | POA: Diagnosis not present

## 2020-08-09 DIAGNOSIS — G894 Chronic pain syndrome: Secondary | ICD-10-CM | POA: Diagnosis not present

## 2020-08-09 DIAGNOSIS — G4700 Insomnia, unspecified: Secondary | ICD-10-CM | POA: Diagnosis not present

## 2020-08-09 DIAGNOSIS — M810 Age-related osteoporosis without current pathological fracture: Secondary | ICD-10-CM | POA: Diagnosis not present

## 2020-08-09 DIAGNOSIS — Z23 Encounter for immunization: Secondary | ICD-10-CM | POA: Diagnosis not present

## 2020-09-20 DIAGNOSIS — R911 Solitary pulmonary nodule: Secondary | ICD-10-CM | POA: Diagnosis not present

## 2020-09-20 DIAGNOSIS — R918 Other nonspecific abnormal finding of lung field: Secondary | ICD-10-CM | POA: Diagnosis not present

## 2020-09-20 DIAGNOSIS — J439 Emphysema, unspecified: Secondary | ICD-10-CM | POA: Diagnosis not present

## 2020-10-07 DIAGNOSIS — G4733 Obstructive sleep apnea (adult) (pediatric): Secondary | ICD-10-CM | POA: Diagnosis not present

## 2020-10-30 DIAGNOSIS — K219 Gastro-esophageal reflux disease without esophagitis: Secondary | ICD-10-CM | POA: Diagnosis not present

## 2020-10-30 DIAGNOSIS — G8929 Other chronic pain: Secondary | ICD-10-CM | POA: Diagnosis not present

## 2020-10-30 DIAGNOSIS — G4700 Insomnia, unspecified: Secondary | ICD-10-CM | POA: Diagnosis not present

## 2020-10-30 DIAGNOSIS — M810 Age-related osteoporosis without current pathological fracture: Secondary | ICD-10-CM | POA: Diagnosis not present

## 2020-10-30 DIAGNOSIS — E782 Mixed hyperlipidemia: Secondary | ICD-10-CM | POA: Diagnosis not present

## 2020-11-23 DIAGNOSIS — Z20822 Contact with and (suspected) exposure to covid-19: Secondary | ICD-10-CM | POA: Diagnosis not present

## 2020-12-06 DIAGNOSIS — M810 Age-related osteoporosis without current pathological fracture: Secondary | ICD-10-CM | POA: Diagnosis not present

## 2020-12-06 DIAGNOSIS — G4700 Insomnia, unspecified: Secondary | ICD-10-CM | POA: Diagnosis not present

## 2020-12-06 DIAGNOSIS — G8929 Other chronic pain: Secondary | ICD-10-CM | POA: Diagnosis not present

## 2020-12-06 DIAGNOSIS — K219 Gastro-esophageal reflux disease without esophagitis: Secondary | ICD-10-CM | POA: Diagnosis not present

## 2020-12-06 DIAGNOSIS — E782 Mixed hyperlipidemia: Secondary | ICD-10-CM | POA: Diagnosis not present

## 2020-12-13 DIAGNOSIS — G894 Chronic pain syndrome: Secondary | ICD-10-CM | POA: Diagnosis not present

## 2021-01-03 DIAGNOSIS — G4733 Obstructive sleep apnea (adult) (pediatric): Secondary | ICD-10-CM | POA: Diagnosis not present

## 2021-01-06 DIAGNOSIS — G4733 Obstructive sleep apnea (adult) (pediatric): Secondary | ICD-10-CM | POA: Diagnosis not present

## 2021-01-12 IMAGING — CT CT HEAD WITHOUT CONTRAST
4 series · 15 of 47 positions shown, 17 images · non-contrast
Comparison: None.

CLINICAL DATA: Altered mental status.

EXAM:
CT HEAD WITHOUT CONTRAST
TECHNIQUE: Contiguous axial images were obtained from the base of the skull
through the vertex without intravenous contrast.

[Series 3: head wo · axial · 0.41mm/px · z∈[-190,-70]mm · 7 of 33 slices shown, 9 images]
[im 5/33  brain]
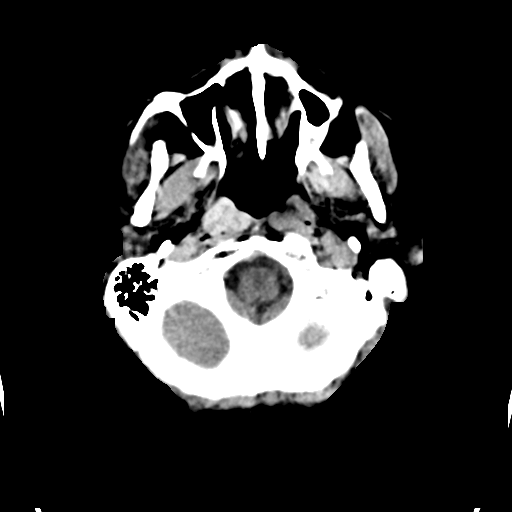
[im 5/33  bone]
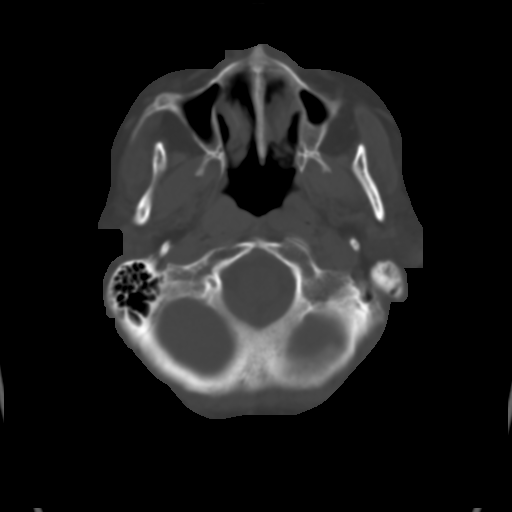
[im 9/33  brain]
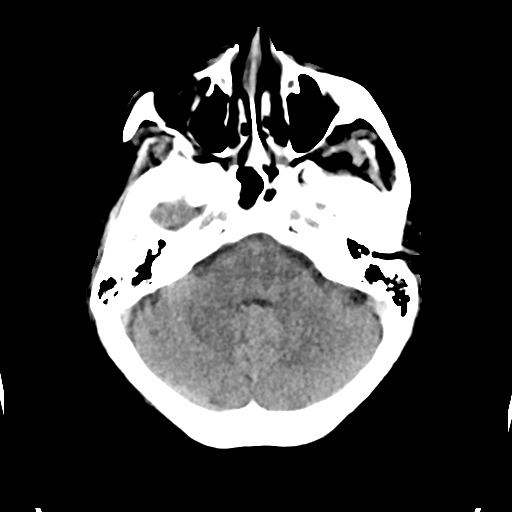
[im 13/33  brain]
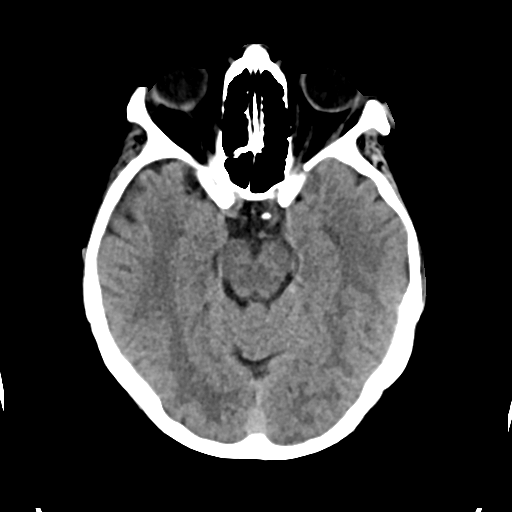
[im 17/33  brain]
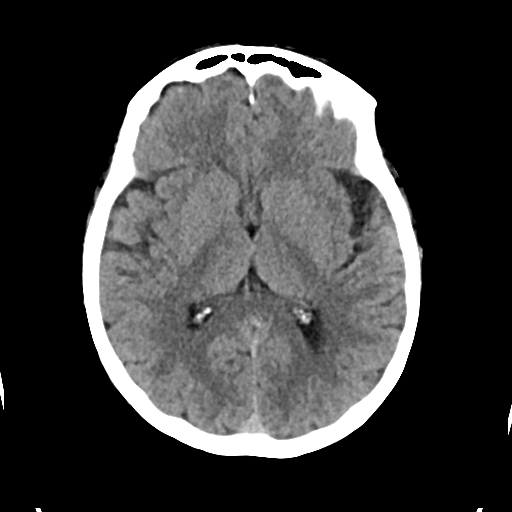
[im 21/33  brain]
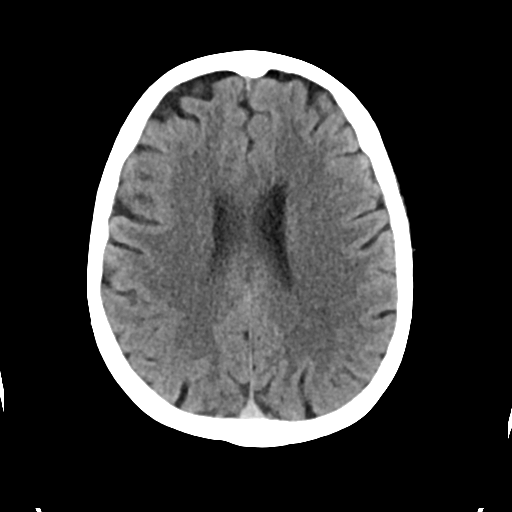
[im 21/33  bone]
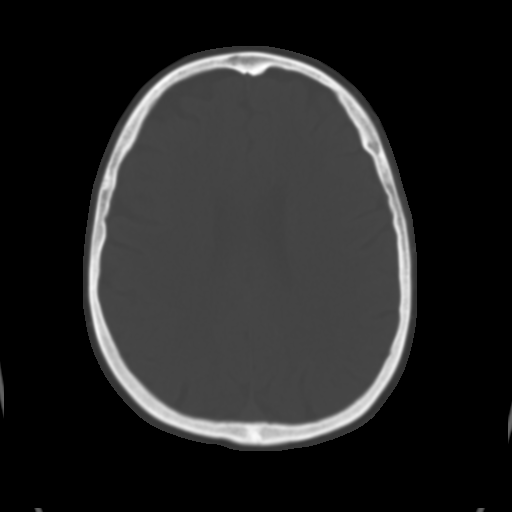
[im 25/33  brain]
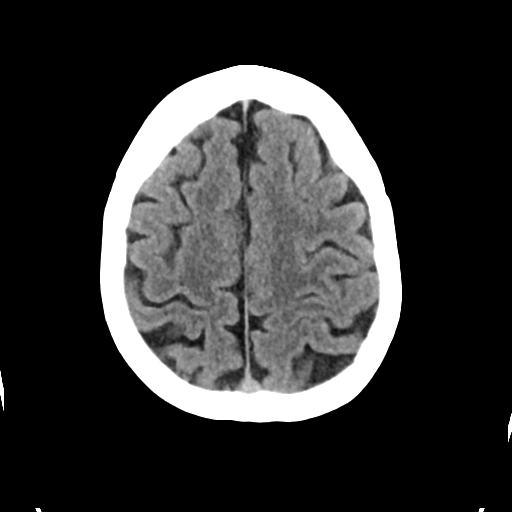
[im 29/33  brain]
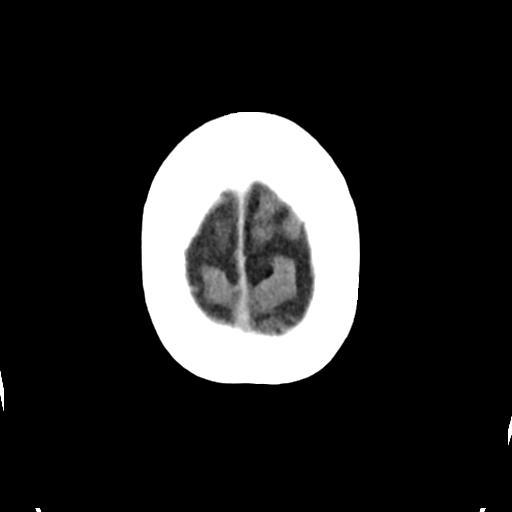

[Series 4: head bone · axial · 0.41mm/px · z∈[-194,-178]mm · 2 of 82 slices shown]
[im 9/82  bone]
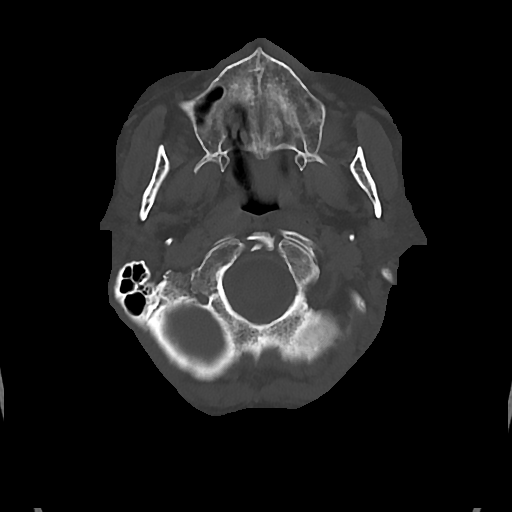
[im 17/82  bone]
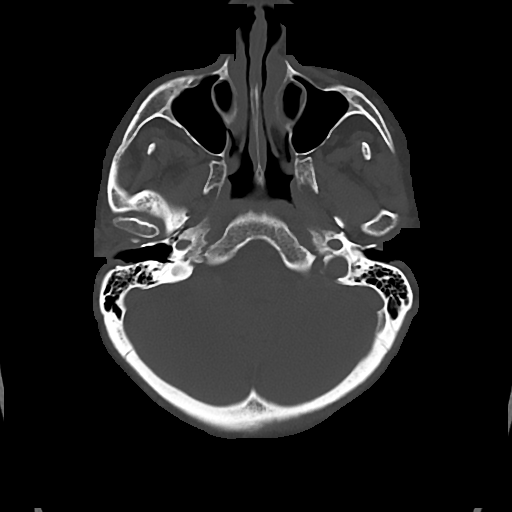

[Series 5: cor soft · coronal · 0.32mm/px · 3 of 71 slices shown]
[im 24/71  brain]
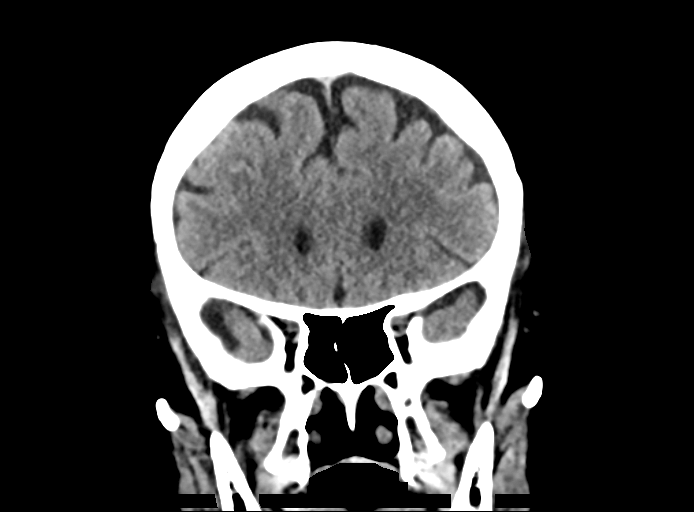
[im 32/71  brain]
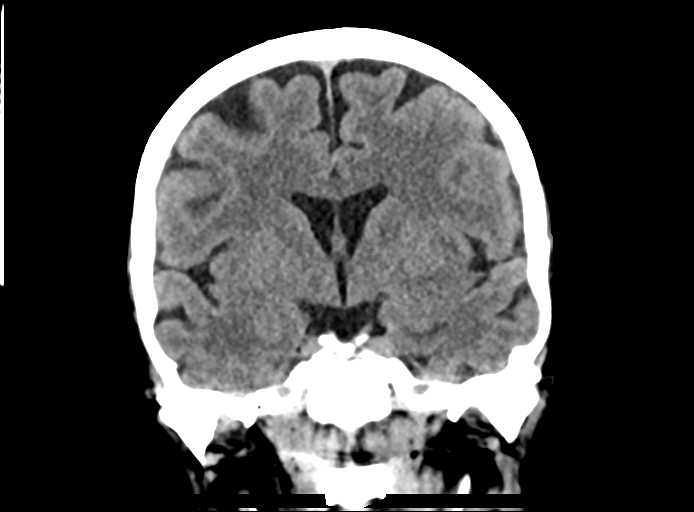
[im 39/71  brain]
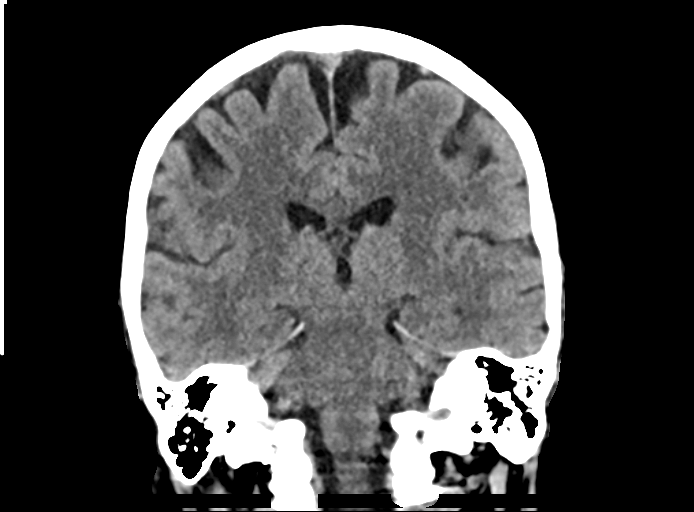

[Series 6: sag soft · sagittal · 0.32mm/px · 3 of 67 slices shown]
[im 23/67  brain]
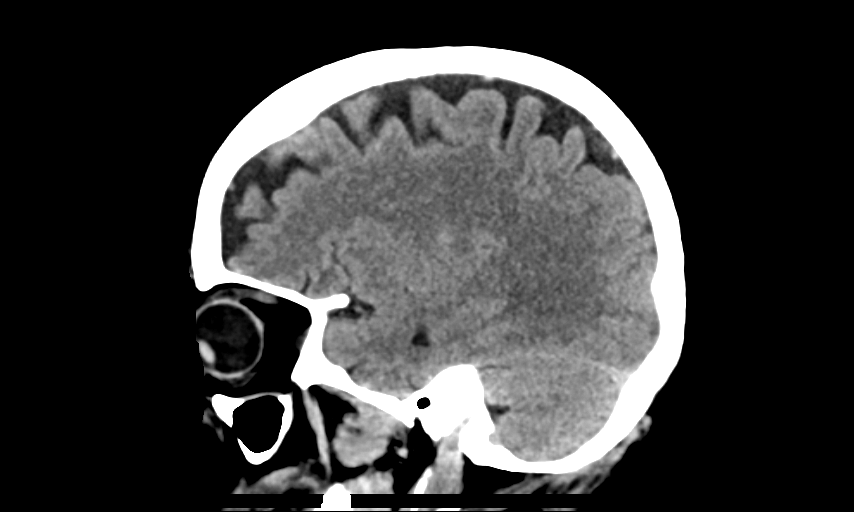
[im 34/67  brain]
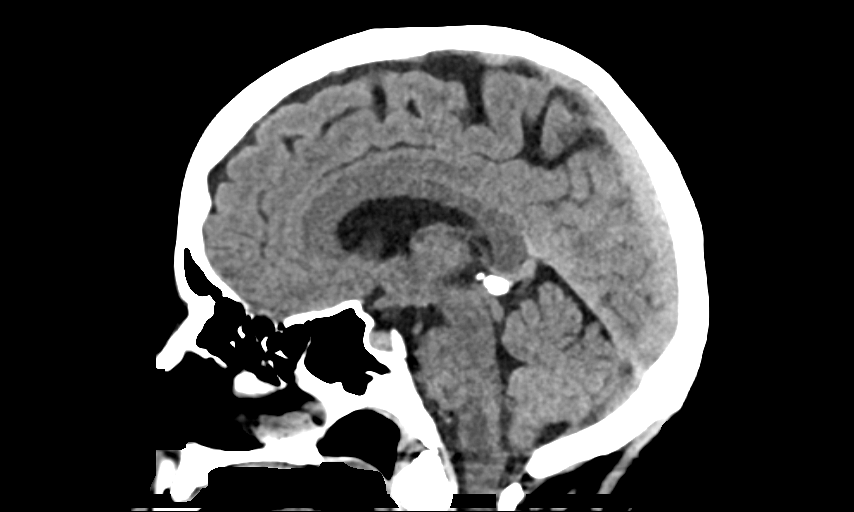
[im 45/67  brain]
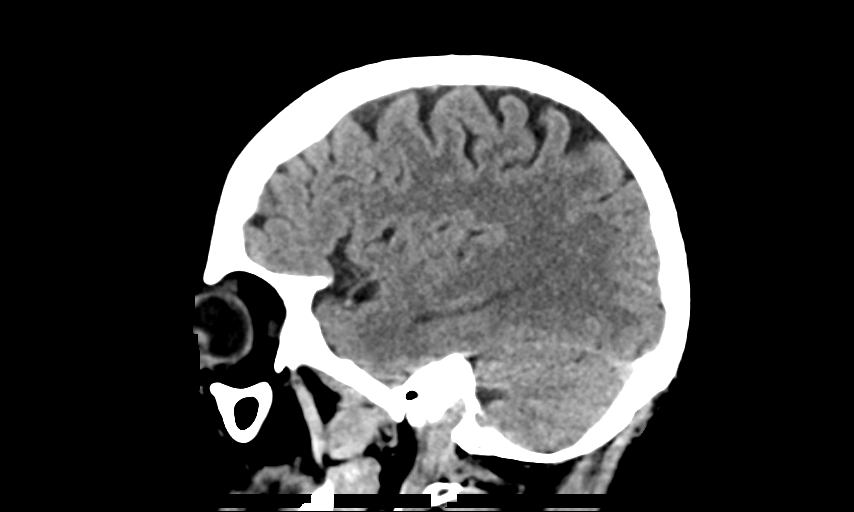

[15 of 47 positions shown; findings below may reference images not displayed]

FINDINGS: Brain: No evidence of acute infarction, hemorrhage, hydrocephalus,
extra-axial collection or mass lesion/mass effect. Mild cerebral
atrophy.

Vascular: No hyperdense vessel or unexpected calcification.

Skull: Calvarium appears intact.

Sinuses/Orbits: Paranasal sinuses and mastoid air cells are clear.

Other: None.
IMPRESSION: No acute intracranial abnormalities.

## 2021-01-12 IMAGING — DX CHEST - 2 VIEW
2 series · 2 of 2 positions shown · non-contrast
Comparison: 08/03/2012

CLINICAL DATA: Patient started a new medication 3 days ago and
since then has been seeing and hearing things that are not there.
Decreased sleep and lethargy. Altered mental status.

EXAM:
CHEST - 2 VIEW

[chest lat]
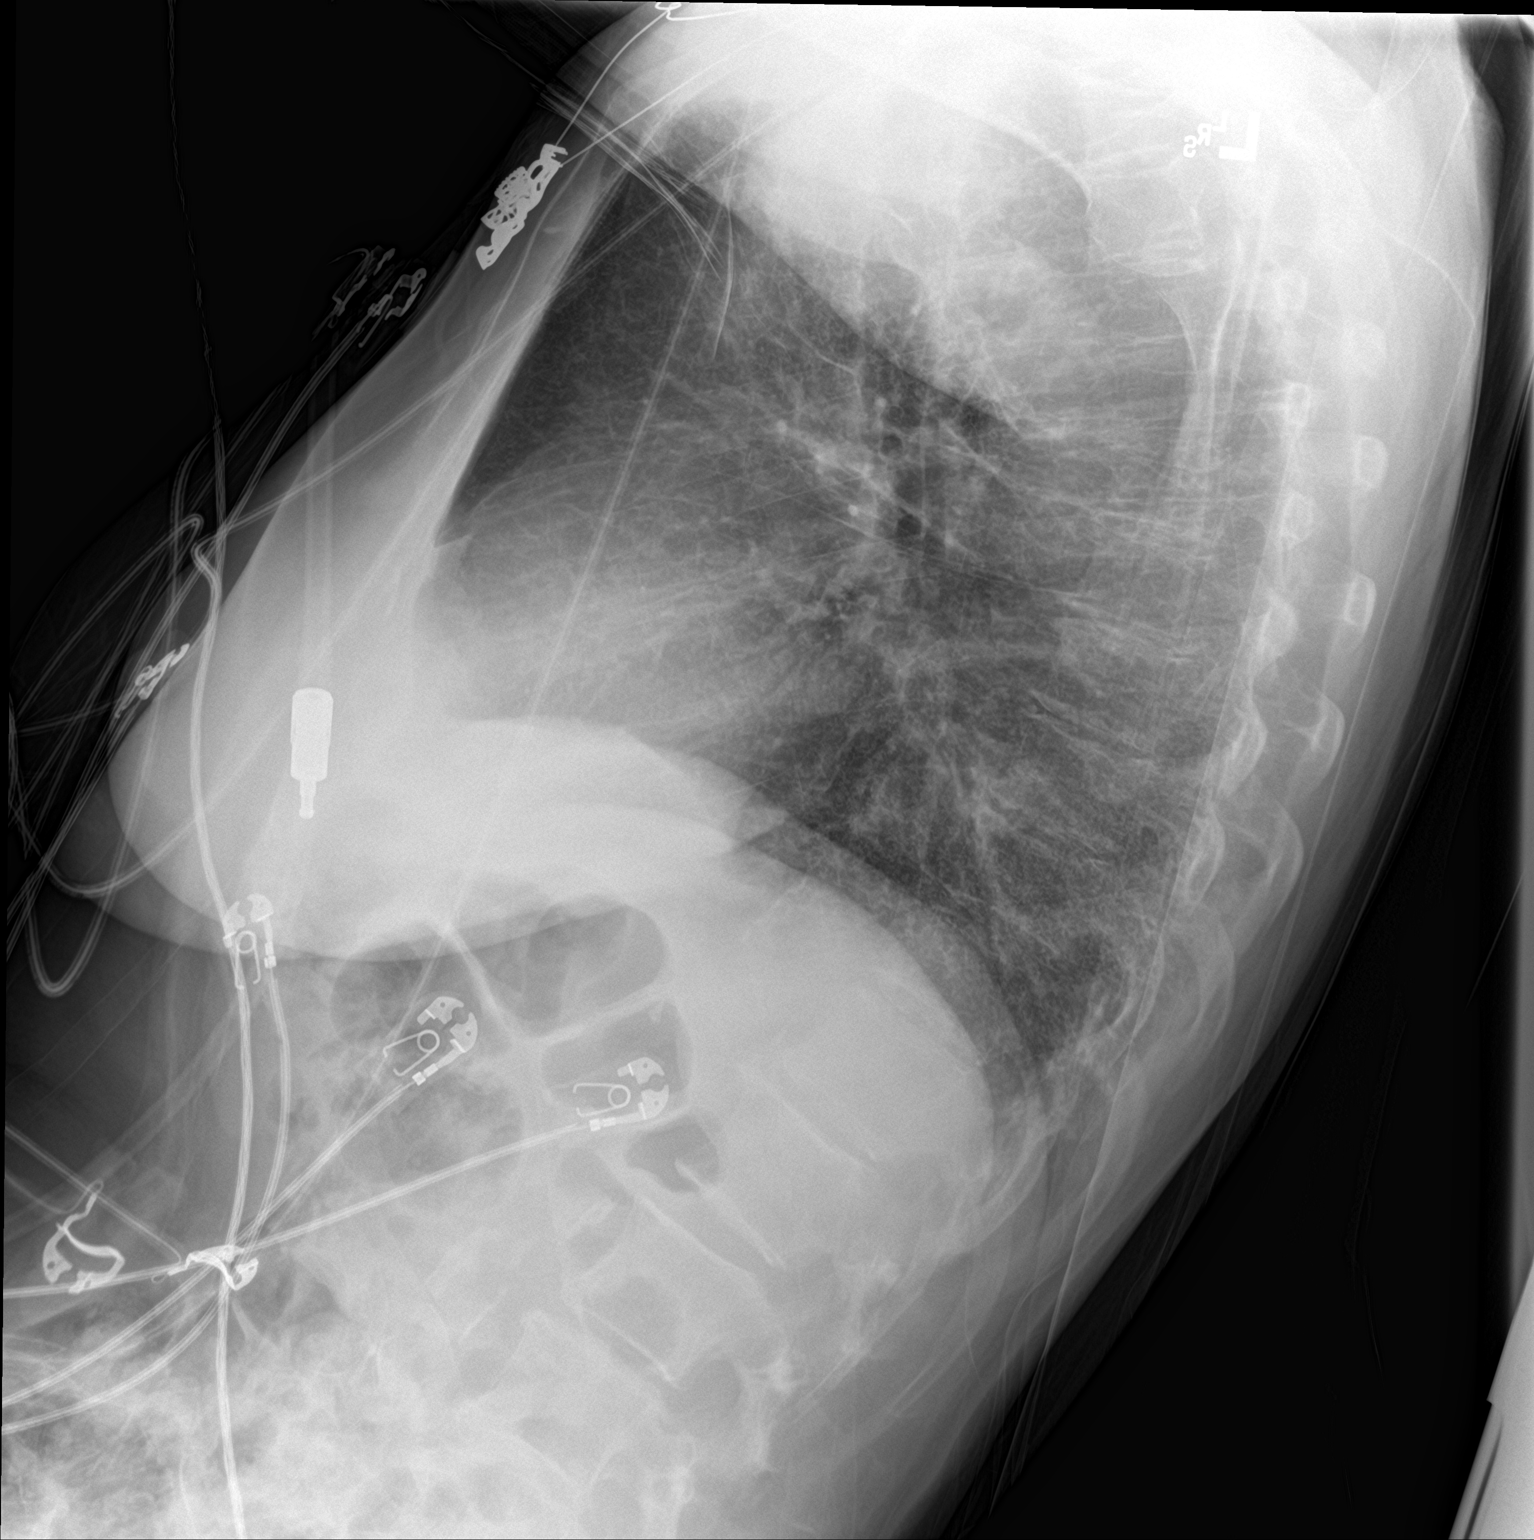

[chest ap]
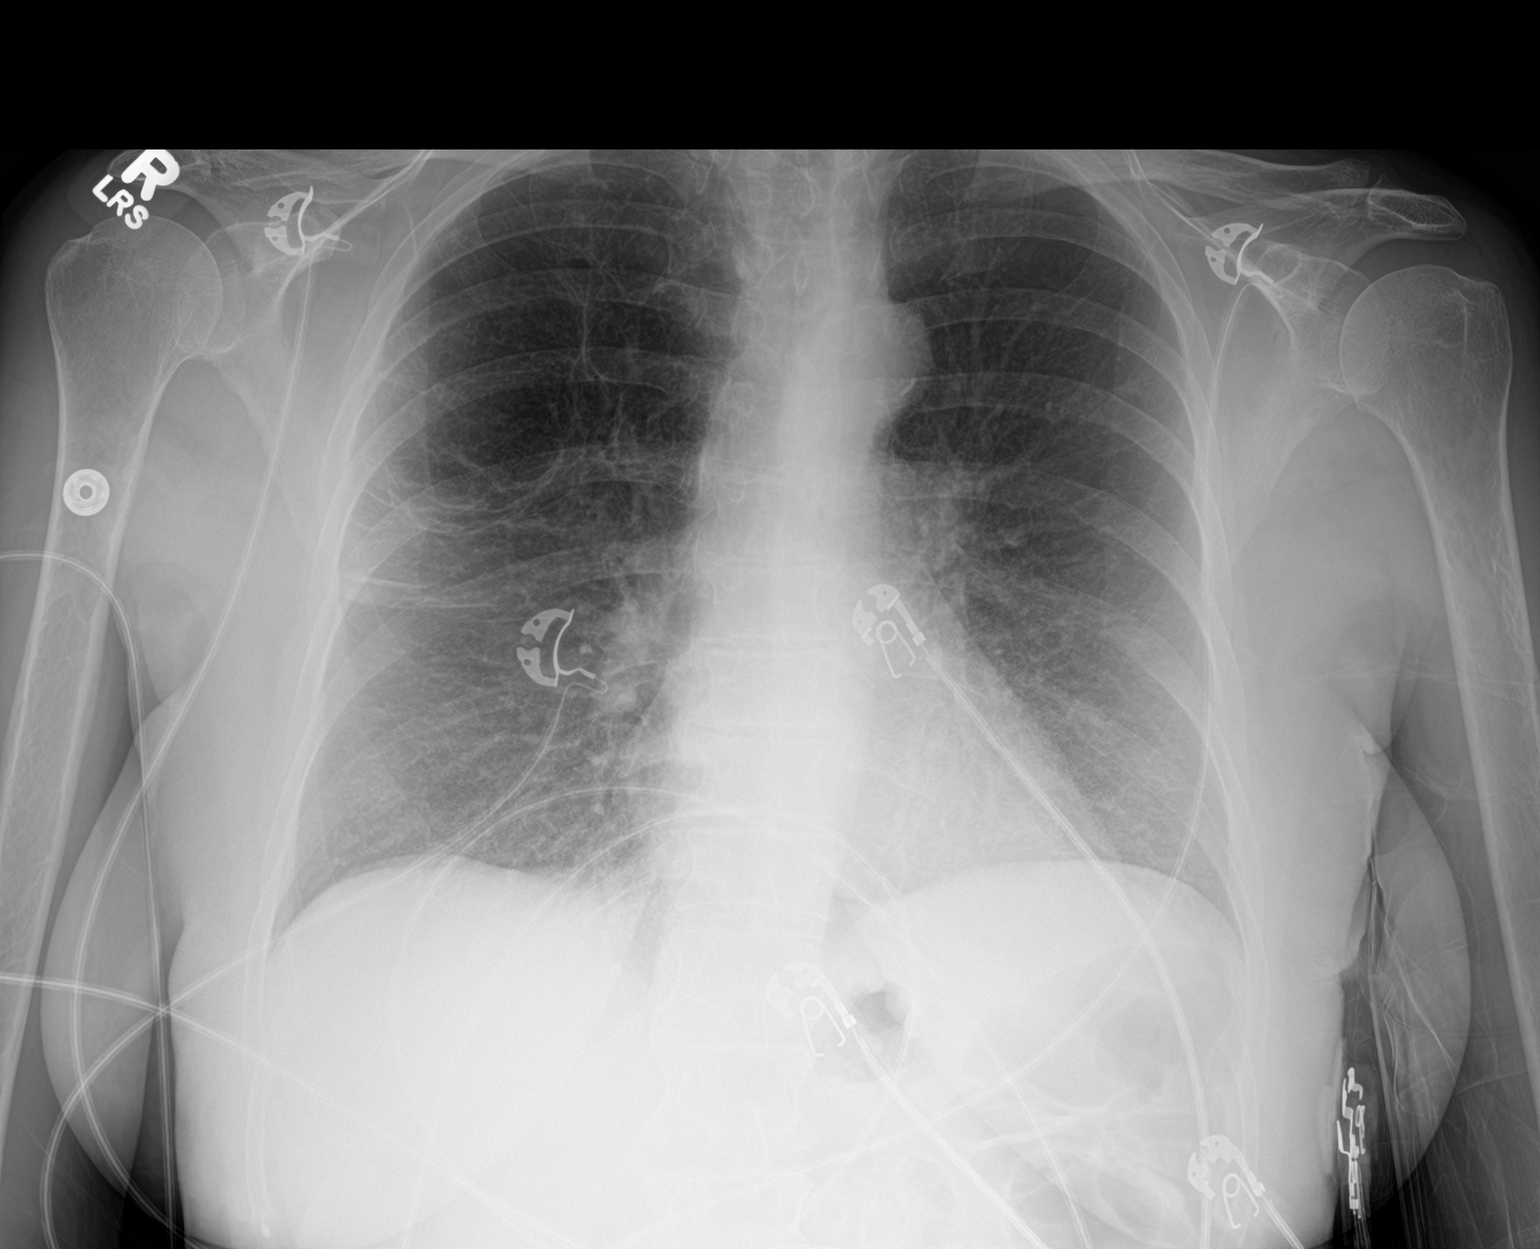

[2 of 2 positions shown; findings below may reference images not displayed]

FINDINGS: Normal heart size and pulmonary vascularity. Bullous emphysematous
changes in the upper lungs. Slight interstitial fibrosis, likely
chronic bronchitis. No focal consolidation. No blunting of
costophrenic angles. No pneumothorax. Mediastinal contours appear
intact. Calcification of the aorta. Degenerative changes in the
spine.
IMPRESSION: Emphysematous and chronic bronchitic changes in the lungs. No
evidence of active pulmonary disease.

## 2021-02-03 DIAGNOSIS — G4733 Obstructive sleep apnea (adult) (pediatric): Secondary | ICD-10-CM | POA: Diagnosis not present

## 2021-03-05 DIAGNOSIS — G4733 Obstructive sleep apnea (adult) (pediatric): Secondary | ICD-10-CM | POA: Diagnosis not present

## 2021-03-14 DIAGNOSIS — F322 Major depressive disorder, single episode, severe without psychotic features: Secondary | ICD-10-CM | POA: Diagnosis not present

## 2021-03-14 DIAGNOSIS — F17200 Nicotine dependence, unspecified, uncomplicated: Secondary | ICD-10-CM | POA: Diagnosis not present

## 2021-03-14 DIAGNOSIS — Z23 Encounter for immunization: Secondary | ICD-10-CM | POA: Diagnosis not present

## 2021-03-14 DIAGNOSIS — Z0000 Encounter for general adult medical examination without abnormal findings: Secondary | ICD-10-CM | POA: Diagnosis not present

## 2021-03-14 DIAGNOSIS — G4700 Insomnia, unspecified: Secondary | ICD-10-CM | POA: Diagnosis not present

## 2021-03-14 DIAGNOSIS — G5790 Unspecified mononeuropathy of unspecified lower limb: Secondary | ICD-10-CM | POA: Diagnosis not present

## 2021-03-14 DIAGNOSIS — G894 Chronic pain syndrome: Secondary | ICD-10-CM | POA: Diagnosis not present

## 2021-04-05 DIAGNOSIS — G4733 Obstructive sleep apnea (adult) (pediatric): Secondary | ICD-10-CM | POA: Diagnosis not present

## 2021-04-08 DIAGNOSIS — G4733 Obstructive sleep apnea (adult) (pediatric): Secondary | ICD-10-CM | POA: Diagnosis not present

## 2021-05-05 DIAGNOSIS — G4733 Obstructive sleep apnea (adult) (pediatric): Secondary | ICD-10-CM | POA: Diagnosis not present

## 2021-05-16 DIAGNOSIS — F322 Major depressive disorder, single episode, severe without psychotic features: Secondary | ICD-10-CM | POA: Diagnosis not present

## 2021-05-16 DIAGNOSIS — G4700 Insomnia, unspecified: Secondary | ICD-10-CM | POA: Diagnosis not present

## 2021-05-16 DIAGNOSIS — M810 Age-related osteoporosis without current pathological fracture: Secondary | ICD-10-CM | POA: Diagnosis not present

## 2021-05-16 DIAGNOSIS — E782 Mixed hyperlipidemia: Secondary | ICD-10-CM | POA: Diagnosis not present

## 2021-05-16 DIAGNOSIS — K219 Gastro-esophageal reflux disease without esophagitis: Secondary | ICD-10-CM | POA: Diagnosis not present

## 2021-05-16 DIAGNOSIS — G8929 Other chronic pain: Secondary | ICD-10-CM | POA: Diagnosis not present

## 2021-05-23 DIAGNOSIS — F17210 Nicotine dependence, cigarettes, uncomplicated: Secondary | ICD-10-CM | POA: Diagnosis not present

## 2021-05-23 DIAGNOSIS — G4733 Obstructive sleep apnea (adult) (pediatric): Secondary | ICD-10-CM | POA: Diagnosis not present

## 2021-06-05 DIAGNOSIS — G4733 Obstructive sleep apnea (adult) (pediatric): Secondary | ICD-10-CM | POA: Diagnosis not present

## 2021-06-13 DIAGNOSIS — M810 Age-related osteoporosis without current pathological fracture: Secondary | ICD-10-CM | POA: Diagnosis not present

## 2021-06-13 DIAGNOSIS — F322 Major depressive disorder, single episode, severe without psychotic features: Secondary | ICD-10-CM | POA: Diagnosis not present

## 2021-06-13 DIAGNOSIS — G4700 Insomnia, unspecified: Secondary | ICD-10-CM | POA: Diagnosis not present

## 2021-06-13 DIAGNOSIS — G8929 Other chronic pain: Secondary | ICD-10-CM | POA: Diagnosis not present

## 2021-06-13 DIAGNOSIS — Z1231 Encounter for screening mammogram for malignant neoplasm of breast: Secondary | ICD-10-CM | POA: Diagnosis not present

## 2021-06-16 ENCOUNTER — Other Ambulatory Visit: Payer: Self-pay | Admitting: Family Medicine

## 2021-06-16 DIAGNOSIS — M810 Age-related osteoporosis without current pathological fracture: Secondary | ICD-10-CM

## 2021-06-16 DIAGNOSIS — Z1231 Encounter for screening mammogram for malignant neoplasm of breast: Secondary | ICD-10-CM

## 2021-06-21 DIAGNOSIS — F322 Major depressive disorder, single episode, severe without psychotic features: Secondary | ICD-10-CM | POA: Diagnosis not present

## 2021-06-21 DIAGNOSIS — G8929 Other chronic pain: Secondary | ICD-10-CM | POA: Diagnosis not present

## 2021-06-21 DIAGNOSIS — E782 Mixed hyperlipidemia: Secondary | ICD-10-CM | POA: Diagnosis not present

## 2021-06-21 DIAGNOSIS — G4700 Insomnia, unspecified: Secondary | ICD-10-CM | POA: Diagnosis not present

## 2021-06-21 DIAGNOSIS — M810 Age-related osteoporosis without current pathological fracture: Secondary | ICD-10-CM | POA: Diagnosis not present

## 2021-06-21 DIAGNOSIS — K219 Gastro-esophageal reflux disease without esophagitis: Secondary | ICD-10-CM | POA: Diagnosis not present

## 2021-06-27 ENCOUNTER — Other Ambulatory Visit: Payer: Self-pay | Admitting: Family Medicine

## 2021-06-27 DIAGNOSIS — Z1231 Encounter for screening mammogram for malignant neoplasm of breast: Secondary | ICD-10-CM

## 2021-06-27 DIAGNOSIS — M810 Age-related osteoporosis without current pathological fracture: Secondary | ICD-10-CM

## 2021-07-03 ENCOUNTER — Inpatient Hospital Stay: Admission: RE | Admit: 2021-07-03 | Payer: Medicare HMO | Source: Ambulatory Visit

## 2021-07-03 ENCOUNTER — Other Ambulatory Visit: Payer: Medicare HMO

## 2021-07-06 DIAGNOSIS — G4733 Obstructive sleep apnea (adult) (pediatric): Secondary | ICD-10-CM | POA: Diagnosis not present

## 2021-07-09 DIAGNOSIS — G4733 Obstructive sleep apnea (adult) (pediatric): Secondary | ICD-10-CM | POA: Diagnosis not present

## 2021-07-10 DIAGNOSIS — G4733 Obstructive sleep apnea (adult) (pediatric): Secondary | ICD-10-CM | POA: Diagnosis not present

## 2021-07-17 ENCOUNTER — Ambulatory Visit
Admission: RE | Admit: 2021-07-17 | Discharge: 2021-07-17 | Disposition: A | Discharge status: Home / Self Care | Payer: Medicare HMO | Source: Ambulatory Visit | Attending: Family Medicine | Admitting: Family Medicine

## 2021-07-17 ENCOUNTER — Other Ambulatory Visit: Payer: Self-pay

## 2021-07-17 DIAGNOSIS — Z1231 Encounter for screening mammogram for malignant neoplasm of breast: Secondary | ICD-10-CM

## 2021-07-17 DIAGNOSIS — M8588 Other specified disorders of bone density and structure, other site: Secondary | ICD-10-CM | POA: Diagnosis not present

## 2021-07-17 DIAGNOSIS — M810 Age-related osteoporosis without current pathological fracture: Secondary | ICD-10-CM | POA: Diagnosis not present

## 2021-07-17 DIAGNOSIS — Z780 Asymptomatic menopausal state: Secondary | ICD-10-CM | POA: Diagnosis not present

## 2021-08-01 DIAGNOSIS — Z23 Encounter for immunization: Secondary | ICD-10-CM | POA: Diagnosis not present

## 2021-08-01 DIAGNOSIS — F17210 Nicotine dependence, cigarettes, uncomplicated: Secondary | ICD-10-CM | POA: Diagnosis not present

## 2021-08-01 DIAGNOSIS — G4733 Obstructive sleep apnea (adult) (pediatric): Secondary | ICD-10-CM | POA: Diagnosis not present

## 2021-08-05 DIAGNOSIS — G4733 Obstructive sleep apnea (adult) (pediatric): Secondary | ICD-10-CM | POA: Diagnosis not present

## 2021-08-08 DIAGNOSIS — G4700 Insomnia, unspecified: Secondary | ICD-10-CM | POA: Diagnosis not present

## 2021-08-08 DIAGNOSIS — K219 Gastro-esophageal reflux disease without esophagitis: Secondary | ICD-10-CM | POA: Diagnosis not present

## 2021-08-08 DIAGNOSIS — M810 Age-related osteoporosis without current pathological fracture: Secondary | ICD-10-CM | POA: Diagnosis not present

## 2021-08-08 DIAGNOSIS — G8929 Other chronic pain: Secondary | ICD-10-CM | POA: Diagnosis not present

## 2021-08-08 DIAGNOSIS — E782 Mixed hyperlipidemia: Secondary | ICD-10-CM | POA: Diagnosis not present

## 2021-08-08 DIAGNOSIS — F322 Major depressive disorder, single episode, severe without psychotic features: Secondary | ICD-10-CM | POA: Diagnosis not present

## 2021-09-05 DIAGNOSIS — G4733 Obstructive sleep apnea (adult) (pediatric): Secondary | ICD-10-CM | POA: Diagnosis not present

## 2021-09-12 DIAGNOSIS — G8929 Other chronic pain: Secondary | ICD-10-CM | POA: Diagnosis not present

## 2021-09-12 DIAGNOSIS — G4700 Insomnia, unspecified: Secondary | ICD-10-CM | POA: Diagnosis not present

## 2021-10-03 DIAGNOSIS — F17210 Nicotine dependence, cigarettes, uncomplicated: Secondary | ICD-10-CM | POA: Diagnosis not present

## 2021-10-29 DIAGNOSIS — K219 Gastro-esophageal reflux disease without esophagitis: Secondary | ICD-10-CM | POA: Diagnosis not present

## 2021-10-29 DIAGNOSIS — G8929 Other chronic pain: Secondary | ICD-10-CM | POA: Diagnosis not present

## 2021-10-29 DIAGNOSIS — E782 Mixed hyperlipidemia: Secondary | ICD-10-CM | POA: Diagnosis not present

## 2021-10-29 DIAGNOSIS — M810 Age-related osteoporosis without current pathological fracture: Secondary | ICD-10-CM | POA: Diagnosis not present

## 2021-10-29 DIAGNOSIS — G4700 Insomnia, unspecified: Secondary | ICD-10-CM | POA: Diagnosis not present

## 2021-10-31 DIAGNOSIS — G4733 Obstructive sleep apnea (adult) (pediatric): Secondary | ICD-10-CM | POA: Diagnosis not present

## 2021-10-31 DIAGNOSIS — R911 Solitary pulmonary nodule: Secondary | ICD-10-CM | POA: Diagnosis not present

## 2021-11-20 DIAGNOSIS — J9811 Atelectasis: Secondary | ICD-10-CM | POA: Diagnosis not present

## 2021-11-20 DIAGNOSIS — I7090 Unspecified atherosclerosis: Secondary | ICD-10-CM | POA: Diagnosis not present

## 2021-11-20 DIAGNOSIS — R918 Other nonspecific abnormal finding of lung field: Secondary | ICD-10-CM | POA: Diagnosis not present

## 2021-11-21 DIAGNOSIS — J438 Other emphysema: Secondary | ICD-10-CM | POA: Diagnosis not present

## 2021-11-21 DIAGNOSIS — R918 Other nonspecific abnormal finding of lung field: Secondary | ICD-10-CM | POA: Diagnosis not present

## 2021-11-21 DIAGNOSIS — F17210 Nicotine dependence, cigarettes, uncomplicated: Secondary | ICD-10-CM | POA: Diagnosis not present

## 2021-11-21 DIAGNOSIS — G4733 Obstructive sleep apnea (adult) (pediatric): Secondary | ICD-10-CM | POA: Diagnosis not present

## 2021-11-27 DIAGNOSIS — Z9989 Dependence on other enabling machines and devices: Secondary | ICD-10-CM | POA: Diagnosis not present

## 2021-11-27 DIAGNOSIS — J449 Chronic obstructive pulmonary disease, unspecified: Secondary | ICD-10-CM | POA: Diagnosis not present

## 2021-11-27 DIAGNOSIS — R911 Solitary pulmonary nodule: Secondary | ICD-10-CM | POA: Diagnosis not present

## 2021-11-27 DIAGNOSIS — F17219 Nicotine dependence, cigarettes, with unspecified nicotine-induced disorders: Secondary | ICD-10-CM | POA: Diagnosis not present

## 2021-11-27 DIAGNOSIS — Z716 Tobacco abuse counseling: Secondary | ICD-10-CM | POA: Diagnosis not present

## 2021-11-27 DIAGNOSIS — G4733 Obstructive sleep apnea (adult) (pediatric): Secondary | ICD-10-CM | POA: Diagnosis not present

## 2021-12-04 DIAGNOSIS — R9082 White matter disease, unspecified: Secondary | ICD-10-CM | POA: Diagnosis not present

## 2021-12-04 DIAGNOSIS — R911 Solitary pulmonary nodule: Secondary | ICD-10-CM | POA: Diagnosis not present

## 2021-12-09 DIAGNOSIS — G8929 Other chronic pain: Secondary | ICD-10-CM | POA: Diagnosis not present

## 2021-12-09 DIAGNOSIS — G479 Sleep disorder, unspecified: Secondary | ICD-10-CM | POA: Diagnosis not present

## 2021-12-23 DIAGNOSIS — J449 Chronic obstructive pulmonary disease, unspecified: Secondary | ICD-10-CM | POA: Diagnosis not present

## 2021-12-23 DIAGNOSIS — Z7901 Long term (current) use of anticoagulants: Secondary | ICD-10-CM | POA: Diagnosis not present

## 2021-12-23 DIAGNOSIS — M1990 Unspecified osteoarthritis, unspecified site: Secondary | ICD-10-CM | POA: Diagnosis not present

## 2021-12-23 DIAGNOSIS — S52531A Colles' fracture of right radius, initial encounter for closed fracture: Secondary | ICD-10-CM | POA: Diagnosis not present

## 2021-12-23 DIAGNOSIS — J439 Emphysema, unspecified: Secondary | ICD-10-CM | POA: Diagnosis not present

## 2021-12-23 DIAGNOSIS — Z9889 Other specified postprocedural states: Secondary | ICD-10-CM | POA: Diagnosis not present

## 2021-12-23 DIAGNOSIS — J9811 Atelectasis: Secondary | ICD-10-CM | POA: Diagnosis not present

## 2021-12-23 DIAGNOSIS — Z4682 Encounter for fitting and adjustment of non-vascular catheter: Secondary | ICD-10-CM | POA: Diagnosis not present

## 2021-12-23 DIAGNOSIS — R918 Other nonspecific abnormal finding of lung field: Secondary | ICD-10-CM | POA: Diagnosis not present

## 2021-12-23 DIAGNOSIS — R911 Solitary pulmonary nodule: Secondary | ICD-10-CM | POA: Diagnosis not present

## 2021-12-23 DIAGNOSIS — G4730 Sleep apnea, unspecified: Secondary | ICD-10-CM | POA: Diagnosis not present

## 2021-12-23 DIAGNOSIS — M25571 Pain in right ankle and joints of right foot: Secondary | ICD-10-CM | POA: Diagnosis not present

## 2021-12-23 DIAGNOSIS — Z01818 Encounter for other preprocedural examination: Secondary | ICD-10-CM | POA: Diagnosis not present

## 2021-12-23 DIAGNOSIS — J939 Pneumothorax, unspecified: Secondary | ICD-10-CM | POA: Diagnosis not present

## 2021-12-23 DIAGNOSIS — K08109 Complete loss of teeth, unspecified cause, unspecified class: Secondary | ICD-10-CM | POA: Diagnosis not present

## 2021-12-23 DIAGNOSIS — Z01810 Encounter for preprocedural cardiovascular examination: Secondary | ICD-10-CM | POA: Diagnosis not present

## 2021-12-23 DIAGNOSIS — Z01812 Encounter for preprocedural laboratory examination: Secondary | ICD-10-CM | POA: Diagnosis not present

## 2021-12-23 DIAGNOSIS — G8929 Other chronic pain: Secondary | ICD-10-CM | POA: Diagnosis not present

## 2021-12-23 DIAGNOSIS — G4733 Obstructive sleep apnea (adult) (pediatric): Secondary | ICD-10-CM | POA: Diagnosis not present

## 2021-12-23 DIAGNOSIS — Z79899 Other long term (current) drug therapy: Secondary | ICD-10-CM | POA: Diagnosis not present

## 2021-12-23 DIAGNOSIS — K219 Gastro-esophageal reflux disease without esophagitis: Secondary | ICD-10-CM | POA: Diagnosis not present

## 2021-12-23 DIAGNOSIS — C3411 Malignant neoplasm of upper lobe, right bronchus or lung: Secondary | ICD-10-CM | POA: Diagnosis not present

## 2021-12-23 DIAGNOSIS — F17210 Nicotine dependence, cigarettes, uncomplicated: Secondary | ICD-10-CM | POA: Diagnosis not present

## 2021-12-24 DIAGNOSIS — F17210 Nicotine dependence, cigarettes, uncomplicated: Secondary | ICD-10-CM | POA: Diagnosis not present

## 2021-12-24 DIAGNOSIS — J449 Chronic obstructive pulmonary disease, unspecified: Secondary | ICD-10-CM | POA: Diagnosis not present

## 2021-12-24 DIAGNOSIS — Z4682 Encounter for fitting and adjustment of non-vascular catheter: Secondary | ICD-10-CM | POA: Diagnosis not present

## 2021-12-24 DIAGNOSIS — G4730 Sleep apnea, unspecified: Secondary | ICD-10-CM | POA: Diagnosis not present

## 2021-12-24 DIAGNOSIS — Z9889 Other specified postprocedural states: Secondary | ICD-10-CM | POA: Diagnosis not present

## 2021-12-24 DIAGNOSIS — J939 Pneumothorax, unspecified: Secondary | ICD-10-CM | POA: Diagnosis not present

## 2021-12-24 DIAGNOSIS — C3411 Malignant neoplasm of upper lobe, right bronchus or lung: Secondary | ICD-10-CM | POA: Diagnosis not present

## 2021-12-24 DIAGNOSIS — R911 Solitary pulmonary nodule: Secondary | ICD-10-CM | POA: Diagnosis not present

## 2021-12-24 DIAGNOSIS — R918 Other nonspecific abnormal finding of lung field: Secondary | ICD-10-CM | POA: Diagnosis not present

## 2021-12-24 DIAGNOSIS — K219 Gastro-esophageal reflux disease without esophagitis: Secondary | ICD-10-CM | POA: Diagnosis not present

## 2021-12-25 DIAGNOSIS — Z4682 Encounter for fitting and adjustment of non-vascular catheter: Secondary | ICD-10-CM | POA: Diagnosis not present

## 2021-12-25 DIAGNOSIS — Z9889 Other specified postprocedural states: Secondary | ICD-10-CM | POA: Diagnosis not present

## 2021-12-26 DIAGNOSIS — Z4682 Encounter for fitting and adjustment of non-vascular catheter: Secondary | ICD-10-CM | POA: Diagnosis not present

## 2021-12-26 DIAGNOSIS — J9811 Atelectasis: Secondary | ICD-10-CM | POA: Diagnosis not present

## 2021-12-26 DIAGNOSIS — Z9889 Other specified postprocedural states: Secondary | ICD-10-CM | POA: Diagnosis not present

## 2021-12-26 DIAGNOSIS — R918 Other nonspecific abnormal finding of lung field: Secondary | ICD-10-CM | POA: Diagnosis not present

## 2021-12-26 DIAGNOSIS — R911 Solitary pulmonary nodule: Secondary | ICD-10-CM | POA: Diagnosis not present

## 2021-12-27 DIAGNOSIS — R911 Solitary pulmonary nodule: Secondary | ICD-10-CM | POA: Diagnosis not present

## 2021-12-30 DIAGNOSIS — C3411 Malignant neoplasm of upper lobe, right bronchus or lung: Secondary | ICD-10-CM | POA: Diagnosis not present

## 2022-01-01 DIAGNOSIS — C3411 Malignant neoplasm of upper lobe, right bronchus or lung: Secondary | ICD-10-CM | POA: Diagnosis not present

## 2022-01-01 DIAGNOSIS — Z09 Encounter for follow-up examination after completed treatment for conditions other than malignant neoplasm: Secondary | ICD-10-CM | POA: Diagnosis not present

## 2022-01-01 DIAGNOSIS — R911 Solitary pulmonary nodule: Secondary | ICD-10-CM | POA: Diagnosis not present

## 2022-01-07 DIAGNOSIS — C3411 Malignant neoplasm of upper lobe, right bronchus or lung: Secondary | ICD-10-CM | POA: Diagnosis not present

## 2022-01-10 DIAGNOSIS — C3411 Malignant neoplasm of upper lobe, right bronchus or lung: Secondary | ICD-10-CM | POA: Diagnosis not present

## 2022-01-14 DIAGNOSIS — J986 Disorders of diaphragm: Secondary | ICD-10-CM | POA: Diagnosis not present

## 2022-01-14 DIAGNOSIS — Z902 Acquired absence of lung [part of]: Secondary | ICD-10-CM | POA: Diagnosis not present

## 2022-01-23 DIAGNOSIS — S52531D Colles' fracture of right radius, subsequent encounter for closed fracture with routine healing: Secondary | ICD-10-CM | POA: Diagnosis not present

## 2022-01-23 DIAGNOSIS — M25572 Pain in left ankle and joints of left foot: Secondary | ICD-10-CM | POA: Diagnosis not present

## 2022-01-23 DIAGNOSIS — C3411 Malignant neoplasm of upper lobe, right bronchus or lung: Secondary | ICD-10-CM | POA: Diagnosis not present

## 2022-01-23 DIAGNOSIS — G8929 Other chronic pain: Secondary | ICD-10-CM | POA: Diagnosis not present

## 2022-01-23 DIAGNOSIS — Z902 Acquired absence of lung [part of]: Secondary | ICD-10-CM | POA: Diagnosis not present

## 2022-02-11 DIAGNOSIS — F322 Major depressive disorder, single episode, severe without psychotic features: Secondary | ICD-10-CM | POA: Diagnosis not present

## 2022-02-11 DIAGNOSIS — E782 Mixed hyperlipidemia: Secondary | ICD-10-CM | POA: Diagnosis not present

## 2022-02-11 DIAGNOSIS — G8929 Other chronic pain: Secondary | ICD-10-CM | POA: Diagnosis not present

## 2022-02-11 DIAGNOSIS — M810 Age-related osteoporosis without current pathological fracture: Secondary | ICD-10-CM | POA: Diagnosis not present

## 2022-02-11 DIAGNOSIS — K219 Gastro-esophageal reflux disease without esophagitis: Secondary | ICD-10-CM | POA: Diagnosis not present

## 2022-02-11 DIAGNOSIS — G4700 Insomnia, unspecified: Secondary | ICD-10-CM | POA: Diagnosis not present

## 2022-03-06 DIAGNOSIS — G894 Chronic pain syndrome: Secondary | ICD-10-CM | POA: Diagnosis not present

## 2022-03-06 DIAGNOSIS — C3491 Malignant neoplasm of unspecified part of right bronchus or lung: Secondary | ICD-10-CM | POA: Diagnosis not present

## 2022-03-10 DIAGNOSIS — C3411 Malignant neoplasm of upper lobe, right bronchus or lung: Secondary | ICD-10-CM | POA: Diagnosis not present

## 2022-03-10 DIAGNOSIS — G4733 Obstructive sleep apnea (adult) (pediatric): Secondary | ICD-10-CM | POA: Diagnosis not present

## 2022-03-10 DIAGNOSIS — F17219 Nicotine dependence, cigarettes, with unspecified nicotine-induced disorders: Secondary | ICD-10-CM | POA: Diagnosis not present

## 2022-03-10 DIAGNOSIS — J449 Chronic obstructive pulmonary disease, unspecified: Secondary | ICD-10-CM | POA: Diagnosis not present

## 2022-03-13 DIAGNOSIS — H2513 Age-related nuclear cataract, bilateral: Secondary | ICD-10-CM | POA: Diagnosis not present

## 2022-03-13 DIAGNOSIS — H5203 Hypermetropia, bilateral: Secondary | ICD-10-CM | POA: Diagnosis not present

## 2022-03-13 DIAGNOSIS — H524 Presbyopia: Secondary | ICD-10-CM | POA: Diagnosis not present

## 2022-03-13 DIAGNOSIS — H52209 Unspecified astigmatism, unspecified eye: Secondary | ICD-10-CM | POA: Diagnosis not present

## 2022-04-02 DIAGNOSIS — R079 Chest pain, unspecified: Secondary | ICD-10-CM | POA: Diagnosis not present

## 2022-04-06 DIAGNOSIS — R9389 Abnormal findings on diagnostic imaging of other specified body structures: Secondary | ICD-10-CM | POA: Diagnosis not present

## 2022-04-06 DIAGNOSIS — Z85118 Personal history of other malignant neoplasm of bronchus and lung: Secondary | ICD-10-CM | POA: Diagnosis not present

## 2022-04-06 DIAGNOSIS — R0602 Shortness of breath: Secondary | ICD-10-CM | POA: Diagnosis not present

## 2022-04-06 DIAGNOSIS — J439 Emphysema, unspecified: Secondary | ICD-10-CM | POA: Diagnosis not present

## 2022-04-06 DIAGNOSIS — I700 Atherosclerosis of aorta: Secondary | ICD-10-CM | POA: Diagnosis not present

## 2022-06-19 DIAGNOSIS — R918 Other nonspecific abnormal finding of lung field: Secondary | ICD-10-CM | POA: Diagnosis not present

## 2022-06-19 DIAGNOSIS — C3411 Malignant neoplasm of upper lobe, right bronchus or lung: Secondary | ICD-10-CM | POA: Diagnosis not present

## 2022-06-19 DIAGNOSIS — J439 Emphysema, unspecified: Secondary | ICD-10-CM | POA: Diagnosis not present

## 2022-06-22 DIAGNOSIS — K219 Gastro-esophageal reflux disease without esophagitis: Secondary | ICD-10-CM | POA: Diagnosis not present

## 2022-06-22 DIAGNOSIS — G8929 Other chronic pain: Secondary | ICD-10-CM | POA: Diagnosis not present

## 2022-06-22 DIAGNOSIS — G4700 Insomnia, unspecified: Secondary | ICD-10-CM | POA: Diagnosis not present

## 2022-06-22 DIAGNOSIS — J449 Chronic obstructive pulmonary disease, unspecified: Secondary | ICD-10-CM | POA: Diagnosis not present

## 2022-06-22 DIAGNOSIS — F322 Major depressive disorder, single episode, severe without psychotic features: Secondary | ICD-10-CM | POA: Diagnosis not present

## 2022-06-22 DIAGNOSIS — M810 Age-related osteoporosis without current pathological fracture: Secondary | ICD-10-CM | POA: Diagnosis not present

## 2022-06-22 DIAGNOSIS — E782 Mixed hyperlipidemia: Secondary | ICD-10-CM | POA: Diagnosis not present

## 2022-07-03 DIAGNOSIS — G894 Chronic pain syndrome: Secondary | ICD-10-CM | POA: Diagnosis not present

## 2022-07-03 DIAGNOSIS — Z0000 Encounter for general adult medical examination without abnormal findings: Secondary | ICD-10-CM | POA: Diagnosis not present

## 2022-07-03 DIAGNOSIS — F17210 Nicotine dependence, cigarettes, uncomplicated: Secondary | ICD-10-CM | POA: Diagnosis not present

## 2022-07-03 DIAGNOSIS — F322 Major depressive disorder, single episode, severe without psychotic features: Secondary | ICD-10-CM | POA: Diagnosis not present

## 2022-07-03 DIAGNOSIS — M810 Age-related osteoporosis without current pathological fracture: Secondary | ICD-10-CM | POA: Diagnosis not present

## 2022-07-03 DIAGNOSIS — Z23 Encounter for immunization: Secondary | ICD-10-CM | POA: Diagnosis not present

## 2022-07-03 DIAGNOSIS — C3491 Malignant neoplasm of unspecified part of right bronchus or lung: Secondary | ICD-10-CM | POA: Diagnosis not present

## 2022-07-10 DIAGNOSIS — C3411 Malignant neoplasm of upper lobe, right bronchus or lung: Secondary | ICD-10-CM | POA: Diagnosis not present

## 2022-08-13 DIAGNOSIS — E782 Mixed hyperlipidemia: Secondary | ICD-10-CM | POA: Diagnosis not present

## 2022-08-13 DIAGNOSIS — K219 Gastro-esophageal reflux disease without esophagitis: Secondary | ICD-10-CM | POA: Diagnosis not present

## 2022-08-13 DIAGNOSIS — F322 Major depressive disorder, single episode, severe without psychotic features: Secondary | ICD-10-CM | POA: Diagnosis not present

## 2022-08-13 DIAGNOSIS — G8929 Other chronic pain: Secondary | ICD-10-CM | POA: Diagnosis not present

## 2022-08-13 DIAGNOSIS — M810 Age-related osteoporosis without current pathological fracture: Secondary | ICD-10-CM | POA: Diagnosis not present

## 2022-08-13 DIAGNOSIS — J449 Chronic obstructive pulmonary disease, unspecified: Secondary | ICD-10-CM | POA: Diagnosis not present

## 2022-10-02 DIAGNOSIS — G894 Chronic pain syndrome: Secondary | ICD-10-CM | POA: Diagnosis not present

## 2022-10-02 DIAGNOSIS — C3491 Malignant neoplasm of unspecified part of right bronchus or lung: Secondary | ICD-10-CM | POA: Diagnosis not present

## 2022-10-05 DIAGNOSIS — J8410 Pulmonary fibrosis, unspecified: Secondary | ICD-10-CM | POA: Diagnosis not present

## 2022-10-05 DIAGNOSIS — J439 Emphysema, unspecified: Secondary | ICD-10-CM | POA: Diagnosis not present

## 2022-10-05 DIAGNOSIS — C3411 Malignant neoplasm of upper lobe, right bronchus or lung: Secondary | ICD-10-CM | POA: Diagnosis not present

## 2022-10-05 DIAGNOSIS — R918 Other nonspecific abnormal finding of lung field: Secondary | ICD-10-CM | POA: Diagnosis not present

## 2022-10-09 DIAGNOSIS — C3411 Malignant neoplasm of upper lobe, right bronchus or lung: Secondary | ICD-10-CM | POA: Diagnosis not present

## 2022-10-20 DIAGNOSIS — R92333 Mammographic heterogeneous density, bilateral breasts: Secondary | ICD-10-CM | POA: Diagnosis not present

## 2022-10-20 DIAGNOSIS — Z1231 Encounter for screening mammogram for malignant neoplasm of breast: Secondary | ICD-10-CM | POA: Diagnosis not present

## 2022-11-03 DIAGNOSIS — U071 COVID-19: Secondary | ICD-10-CM | POA: Diagnosis not present

## 2022-12-13 DIAGNOSIS — S93401A Sprain of unspecified ligament of right ankle, initial encounter: Secondary | ICD-10-CM | POA: Diagnosis not present

## 2022-12-13 DIAGNOSIS — M25571 Pain in right ankle and joints of right foot: Secondary | ICD-10-CM | POA: Diagnosis not present

## 2023-01-01 DIAGNOSIS — J449 Chronic obstructive pulmonary disease, unspecified: Secondary | ICD-10-CM | POA: Diagnosis not present

## 2023-01-01 DIAGNOSIS — G479 Sleep disorder, unspecified: Secondary | ICD-10-CM | POA: Diagnosis not present

## 2023-01-01 DIAGNOSIS — C3491 Malignant neoplasm of unspecified part of right bronchus or lung: Secondary | ICD-10-CM | POA: Diagnosis not present

## 2023-01-01 DIAGNOSIS — G894 Chronic pain syndrome: Secondary | ICD-10-CM | POA: Diagnosis not present

## 2023-01-09 DIAGNOSIS — M7021 Olecranon bursitis, right elbow: Secondary | ICD-10-CM | POA: Diagnosis not present

## 2023-01-09 DIAGNOSIS — J449 Chronic obstructive pulmonary disease, unspecified: Secondary | ICD-10-CM | POA: Diagnosis not present

## 2023-01-09 DIAGNOSIS — M25421 Effusion, right elbow: Secondary | ICD-10-CM | POA: Diagnosis not present

## 2023-01-09 DIAGNOSIS — F17210 Nicotine dependence, cigarettes, uncomplicated: Secondary | ICD-10-CM | POA: Diagnosis not present

## 2023-01-20 DIAGNOSIS — F17210 Nicotine dependence, cigarettes, uncomplicated: Secondary | ICD-10-CM | POA: Diagnosis not present

## 2023-01-20 DIAGNOSIS — J440 Chronic obstructive pulmonary disease with acute lower respiratory infection: Secondary | ICD-10-CM | POA: Diagnosis not present

## 2023-01-20 DIAGNOSIS — I700 Atherosclerosis of aorta: Secondary | ICD-10-CM | POA: Diagnosis not present

## 2023-01-20 DIAGNOSIS — C3411 Malignant neoplasm of upper lobe, right bronchus or lung: Secondary | ICD-10-CM | POA: Diagnosis not present

## 2023-01-20 DIAGNOSIS — Z9989 Dependence on other enabling machines and devices: Secondary | ICD-10-CM | POA: Diagnosis not present

## 2023-01-20 DIAGNOSIS — G4733 Obstructive sleep apnea (adult) (pediatric): Secondary | ICD-10-CM | POA: Diagnosis not present

## 2023-01-20 DIAGNOSIS — R918 Other nonspecific abnormal finding of lung field: Secondary | ICD-10-CM | POA: Diagnosis not present

## 2023-01-20 DIAGNOSIS — F32A Depression, unspecified: Secondary | ICD-10-CM | POA: Diagnosis not present

## 2023-01-20 DIAGNOSIS — G629 Polyneuropathy, unspecified: Secondary | ICD-10-CM | POA: Diagnosis not present

## 2023-01-20 DIAGNOSIS — Z85118 Personal history of other malignant neoplasm of bronchus and lung: Secondary | ICD-10-CM | POA: Diagnosis not present

## 2023-01-20 DIAGNOSIS — J984 Other disorders of lung: Secondary | ICD-10-CM | POA: Diagnosis not present

## 2023-01-20 DIAGNOSIS — R000 Tachycardia, unspecified: Secondary | ICD-10-CM | POA: Diagnosis not present

## 2023-01-20 DIAGNOSIS — D649 Anemia, unspecified: Secondary | ICD-10-CM | POA: Diagnosis not present

## 2023-01-20 DIAGNOSIS — R5383 Other fatigue: Secondary | ICD-10-CM | POA: Diagnosis not present

## 2023-01-20 DIAGNOSIS — J189 Pneumonia, unspecified organism: Secondary | ICD-10-CM | POA: Diagnosis not present

## 2023-01-20 DIAGNOSIS — R0602 Shortness of breath: Secondary | ICD-10-CM | POA: Diagnosis not present

## 2023-01-20 DIAGNOSIS — E871 Hypo-osmolality and hyponatremia: Secondary | ICD-10-CM | POA: Diagnosis not present

## 2023-01-20 DIAGNOSIS — J449 Chronic obstructive pulmonary disease, unspecified: Secondary | ICD-10-CM | POA: Diagnosis not present

## 2023-01-20 DIAGNOSIS — R519 Headache, unspecified: Secondary | ICD-10-CM | POA: Diagnosis not present

## 2023-01-20 DIAGNOSIS — Z20822 Contact with and (suspected) exposure to covid-19: Secondary | ICD-10-CM | POA: Diagnosis not present

## 2023-01-20 DIAGNOSIS — R059 Cough, unspecified: Secondary | ICD-10-CM | POA: Diagnosis not present

## 2023-01-20 DIAGNOSIS — J929 Pleural plaque without asbestos: Secondary | ICD-10-CM | POA: Diagnosis not present

## 2023-01-20 DIAGNOSIS — I2510 Atherosclerotic heart disease of native coronary artery without angina pectoris: Secondary | ICD-10-CM | POA: Diagnosis not present

## 2023-01-20 DIAGNOSIS — Z66 Do not resuscitate: Secondary | ICD-10-CM | POA: Diagnosis not present

## 2023-01-20 DIAGNOSIS — J439 Emphysema, unspecified: Secondary | ICD-10-CM | POA: Diagnosis not present

## 2023-01-20 DIAGNOSIS — K3189 Other diseases of stomach and duodenum: Secondary | ICD-10-CM | POA: Diagnosis not present

## 2023-01-21 DIAGNOSIS — J189 Pneumonia, unspecified organism: Secondary | ICD-10-CM | POA: Diagnosis not present

## 2023-01-21 DIAGNOSIS — G4733 Obstructive sleep apnea (adult) (pediatric): Secondary | ICD-10-CM | POA: Diagnosis not present

## 2023-01-21 DIAGNOSIS — D649 Anemia, unspecified: Secondary | ICD-10-CM | POA: Diagnosis not present

## 2023-01-21 DIAGNOSIS — J449 Chronic obstructive pulmonary disease, unspecified: Secondary | ICD-10-CM | POA: Diagnosis not present

## 2023-01-21 DIAGNOSIS — E871 Hypo-osmolality and hyponatremia: Secondary | ICD-10-CM | POA: Diagnosis not present

## 2023-01-22 DIAGNOSIS — J189 Pneumonia, unspecified organism: Secondary | ICD-10-CM | POA: Diagnosis not present

## 2023-01-22 DIAGNOSIS — D649 Anemia, unspecified: Secondary | ICD-10-CM | POA: Diagnosis not present

## 2023-01-22 DIAGNOSIS — E871 Hypo-osmolality and hyponatremia: Secondary | ICD-10-CM | POA: Diagnosis not present

## 2023-01-22 DIAGNOSIS — G4733 Obstructive sleep apnea (adult) (pediatric): Secondary | ICD-10-CM | POA: Diagnosis not present

## 2023-01-22 DIAGNOSIS — J449 Chronic obstructive pulmonary disease, unspecified: Secondary | ICD-10-CM | POA: Diagnosis not present

## 2023-01-29 DIAGNOSIS — J439 Emphysema, unspecified: Secondary | ICD-10-CM | POA: Diagnosis not present

## 2023-01-29 DIAGNOSIS — C3411 Malignant neoplasm of upper lobe, right bronchus or lung: Secondary | ICD-10-CM | POA: Diagnosis not present

## 2023-01-29 DIAGNOSIS — R918 Other nonspecific abnormal finding of lung field: Secondary | ICD-10-CM | POA: Diagnosis not present

## 2023-01-29 DIAGNOSIS — I2510 Atherosclerotic heart disease of native coronary artery without angina pectoris: Secondary | ICD-10-CM | POA: Diagnosis not present

## 2023-01-29 DIAGNOSIS — J984 Other disorders of lung: Secondary | ICD-10-CM | POA: Diagnosis not present

## 2023-02-01 DIAGNOSIS — Z886 Allergy status to analgesic agent status: Secondary | ICD-10-CM | POA: Diagnosis not present

## 2023-02-01 DIAGNOSIS — F17210 Nicotine dependence, cigarettes, uncomplicated: Secondary | ICD-10-CM | POA: Diagnosis not present

## 2023-02-01 DIAGNOSIS — S93401A Sprain of unspecified ligament of right ankle, initial encounter: Secondary | ICD-10-CM | POA: Diagnosis not present

## 2023-02-01 DIAGNOSIS — Z85118 Personal history of other malignant neoplasm of bronchus and lung: Secondary | ICD-10-CM | POA: Diagnosis not present

## 2023-02-01 DIAGNOSIS — J449 Chronic obstructive pulmonary disease, unspecified: Secondary | ICD-10-CM | POA: Diagnosis not present

## 2023-02-01 DIAGNOSIS — S99911A Unspecified injury of right ankle, initial encounter: Secondary | ICD-10-CM | POA: Diagnosis not present

## 2023-02-01 DIAGNOSIS — S99921A Unspecified injury of right foot, initial encounter: Secondary | ICD-10-CM | POA: Diagnosis not present

## 2023-02-01 DIAGNOSIS — M1990 Unspecified osteoarthritis, unspecified site: Secondary | ICD-10-CM | POA: Diagnosis not present

## 2023-02-01 DIAGNOSIS — Z888 Allergy status to other drugs, medicaments and biological substances status: Secondary | ICD-10-CM | POA: Diagnosis not present

## 2023-02-01 DIAGNOSIS — Z9989 Dependence on other enabling machines and devices: Secondary | ICD-10-CM | POA: Diagnosis not present

## 2023-02-01 DIAGNOSIS — G4733 Obstructive sleep apnea (adult) (pediatric): Secondary | ICD-10-CM | POA: Diagnosis not present

## 2023-02-05 DIAGNOSIS — J189 Pneumonia, unspecified organism: Secondary | ICD-10-CM | POA: Diagnosis not present

## 2023-02-05 DIAGNOSIS — J449 Chronic obstructive pulmonary disease, unspecified: Secondary | ICD-10-CM | POA: Diagnosis not present

## 2023-02-05 DIAGNOSIS — M25571 Pain in right ankle and joints of right foot: Secondary | ICD-10-CM | POA: Diagnosis not present

## 2023-02-05 DIAGNOSIS — F17200 Nicotine dependence, unspecified, uncomplicated: Secondary | ICD-10-CM | POA: Diagnosis not present

## 2023-02-11 DIAGNOSIS — C3411 Malignant neoplasm of upper lobe, right bronchus or lung: Secondary | ICD-10-CM | POA: Diagnosis not present

## 2023-02-11 DIAGNOSIS — M25572 Pain in left ankle and joints of left foot: Secondary | ICD-10-CM | POA: Diagnosis not present

## 2023-02-11 DIAGNOSIS — G8929 Other chronic pain: Secondary | ICD-10-CM | POA: Diagnosis not present

## 2023-02-11 DIAGNOSIS — Z902 Acquired absence of lung [part of]: Secondary | ICD-10-CM | POA: Diagnosis not present

## 2023-02-12 DIAGNOSIS — F17219 Nicotine dependence, cigarettes, with unspecified nicotine-induced disorders: Secondary | ICD-10-CM | POA: Diagnosis not present

## 2023-02-12 DIAGNOSIS — J449 Chronic obstructive pulmonary disease, unspecified: Secondary | ICD-10-CM | POA: Diagnosis not present

## 2023-02-12 DIAGNOSIS — Z9989 Dependence on other enabling machines and devices: Secondary | ICD-10-CM | POA: Diagnosis not present

## 2023-02-12 DIAGNOSIS — G4733 Obstructive sleep apnea (adult) (pediatric): Secondary | ICD-10-CM | POA: Diagnosis not present

## 2023-02-12 DIAGNOSIS — C3411 Malignant neoplasm of upper lobe, right bronchus or lung: Secondary | ICD-10-CM | POA: Diagnosis not present

## 2023-02-12 DIAGNOSIS — Z716 Tobacco abuse counseling: Secondary | ICD-10-CM | POA: Diagnosis not present

## 2023-02-15 DIAGNOSIS — M79671 Pain in right foot: Secondary | ICD-10-CM | POA: Diagnosis not present

## 2023-02-15 DIAGNOSIS — S93491A Sprain of other ligament of right ankle, initial encounter: Secondary | ICD-10-CM | POA: Diagnosis not present

## 2023-03-08 DIAGNOSIS — M19071 Primary osteoarthritis, right ankle and foot: Secondary | ICD-10-CM | POA: Diagnosis not present

## 2023-03-08 DIAGNOSIS — S93491A Sprain of other ligament of right ankle, initial encounter: Secondary | ICD-10-CM | POA: Diagnosis not present

## 2023-05-14 DIAGNOSIS — G894 Chronic pain syndrome: Secondary | ICD-10-CM | POA: Diagnosis not present

## 2023-05-14 DIAGNOSIS — I10 Essential (primary) hypertension: Secondary | ICD-10-CM | POA: Diagnosis not present

## 2023-05-14 DIAGNOSIS — Z9181 History of falling: Secondary | ICD-10-CM | POA: Diagnosis not present

## 2023-06-15 DIAGNOSIS — Z902 Acquired absence of lung [part of]: Secondary | ICD-10-CM | POA: Diagnosis not present

## 2023-06-15 DIAGNOSIS — C3411 Malignant neoplasm of upper lobe, right bronchus or lung: Secondary | ICD-10-CM | POA: Diagnosis not present

## 2023-06-15 DIAGNOSIS — G8929 Other chronic pain: Secondary | ICD-10-CM | POA: Diagnosis not present

## 2023-06-15 DIAGNOSIS — M25572 Pain in left ankle and joints of left foot: Secondary | ICD-10-CM | POA: Diagnosis not present

## 2023-06-24 DIAGNOSIS — J984 Other disorders of lung: Secondary | ICD-10-CM | POA: Diagnosis not present

## 2023-06-24 DIAGNOSIS — J479 Bronchiectasis, uncomplicated: Secondary | ICD-10-CM | POA: Diagnosis not present

## 2023-06-24 DIAGNOSIS — C3411 Malignant neoplasm of upper lobe, right bronchus or lung: Secondary | ICD-10-CM | POA: Diagnosis not present

## 2023-06-24 DIAGNOSIS — R918 Other nonspecific abnormal finding of lung field: Secondary | ICD-10-CM | POA: Diagnosis not present

## 2023-06-30 DIAGNOSIS — U071 COVID-19: Secondary | ICD-10-CM | POA: Diagnosis not present

## 2023-07-15 IMAGING — MG MM DIGITAL SCREENING BILAT W/ TOMO AND CAD
6 of 10 series · 6 of 30 positions shown · non-contrast
Comparison: Previous exam(s).

CLINICAL DATA: Screening.

EXAM:
DIGITAL SCREENING BILATERAL MAMMOGRAM WITH TOMOSYNTHESIS AND CAD
TECHNIQUE: Bilateral screening digital craniocaudal and mediolateral oblique
mammograms were obtained. Bilateral screening digital breast
tomosynthesis was performed. The images were evaluated with
computer-aided detection.

[R MLO synth-2D]
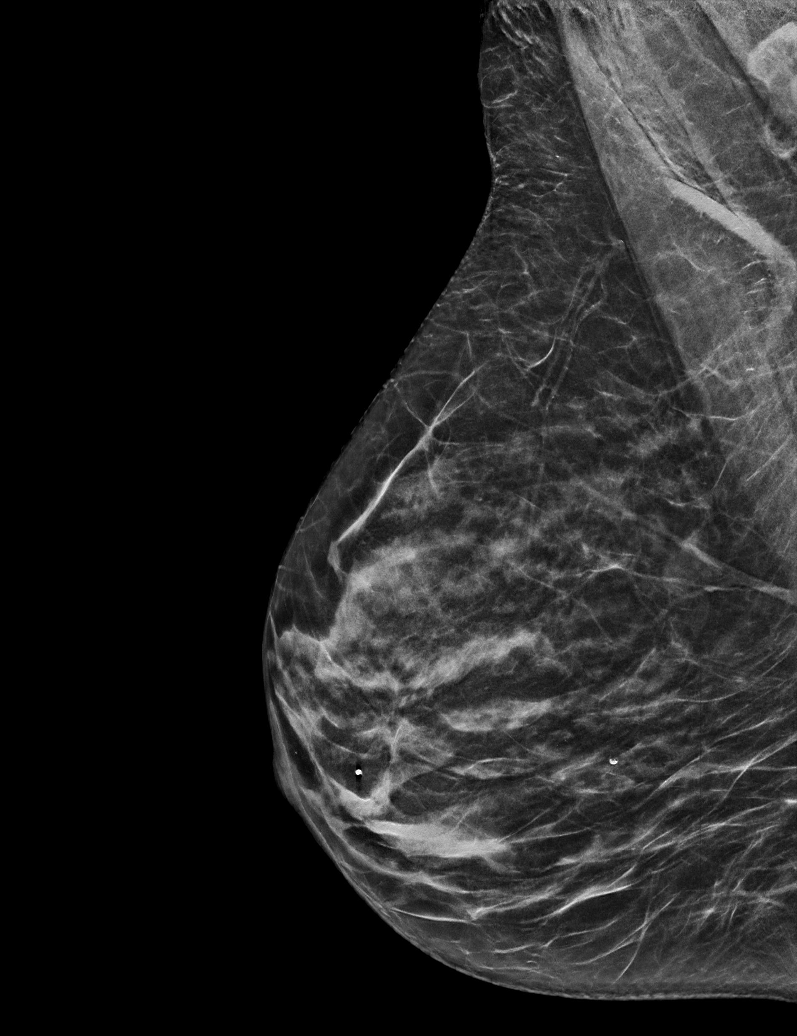

[L MLO synth-2D (1 of 2)]
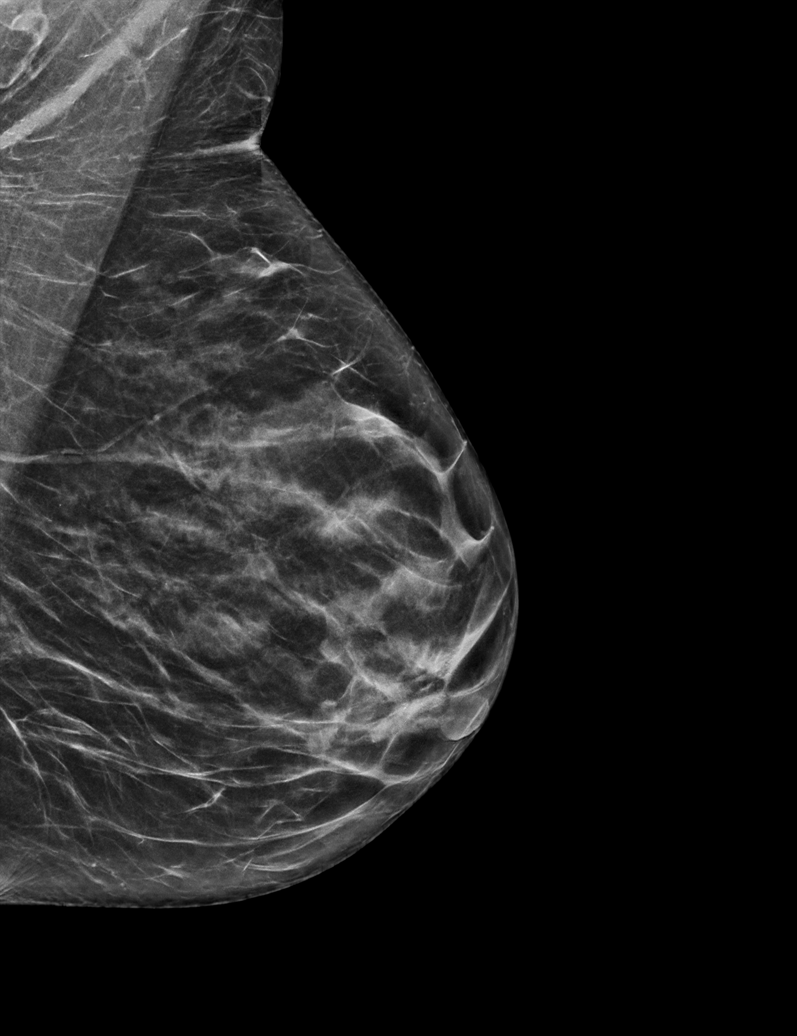

[R CC synth-2D]
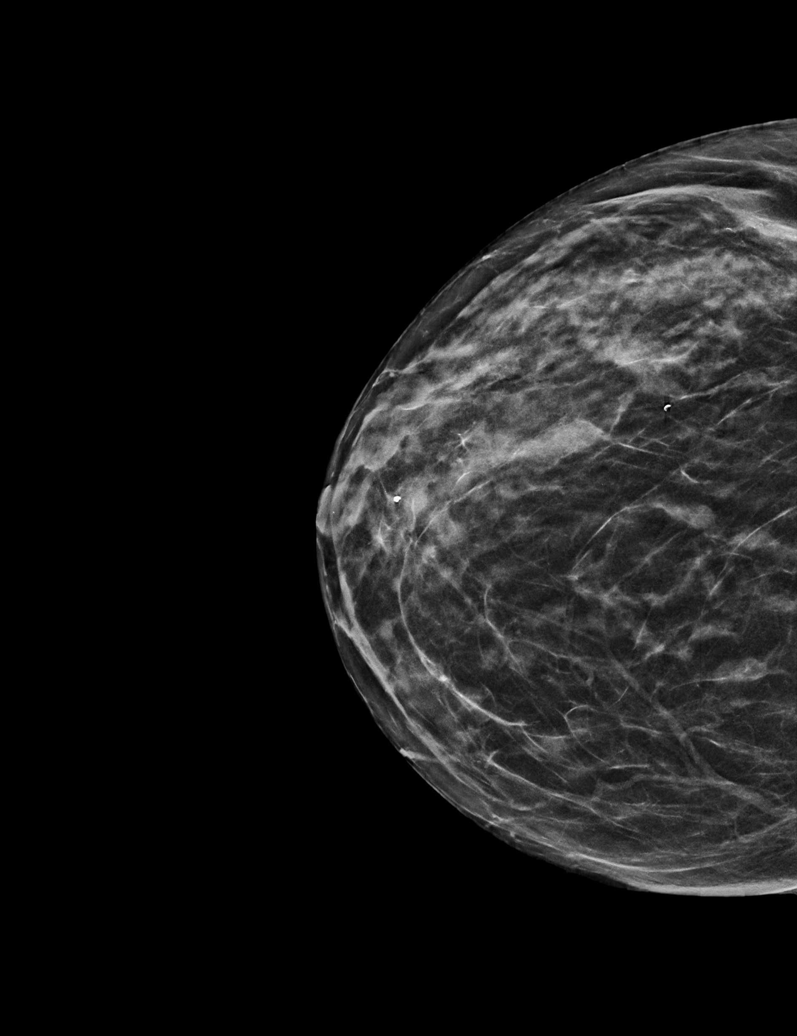

[L MLO synth-2D (2 of 2)]
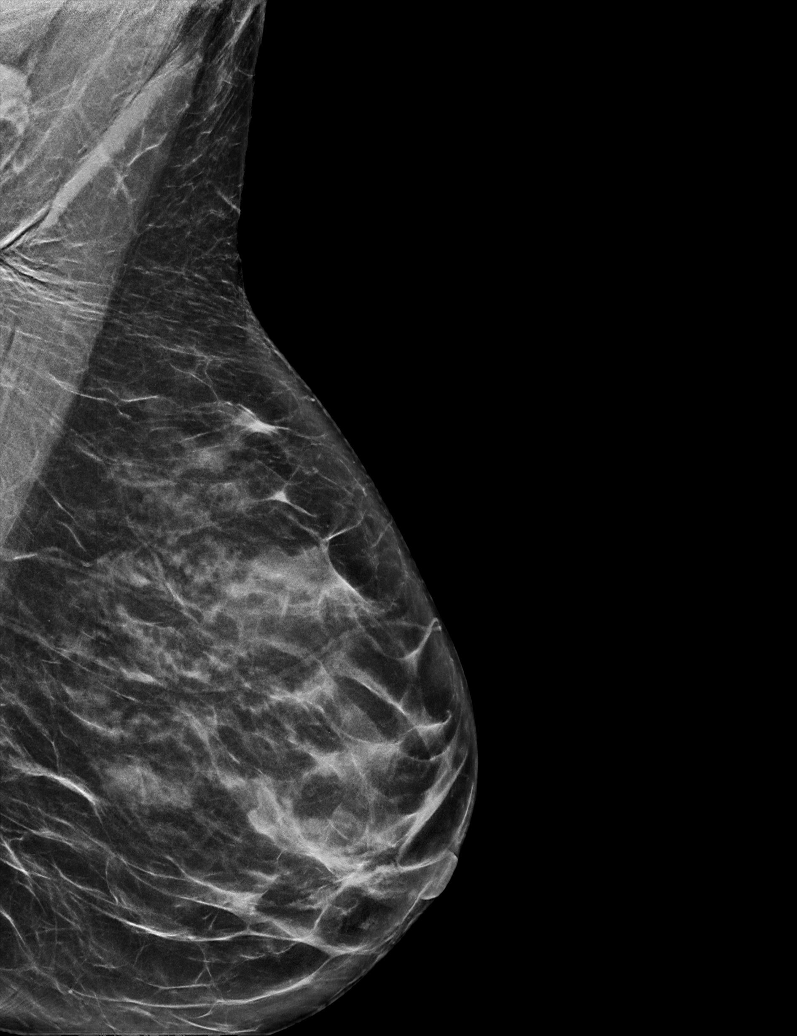

[L CC synth-2D]
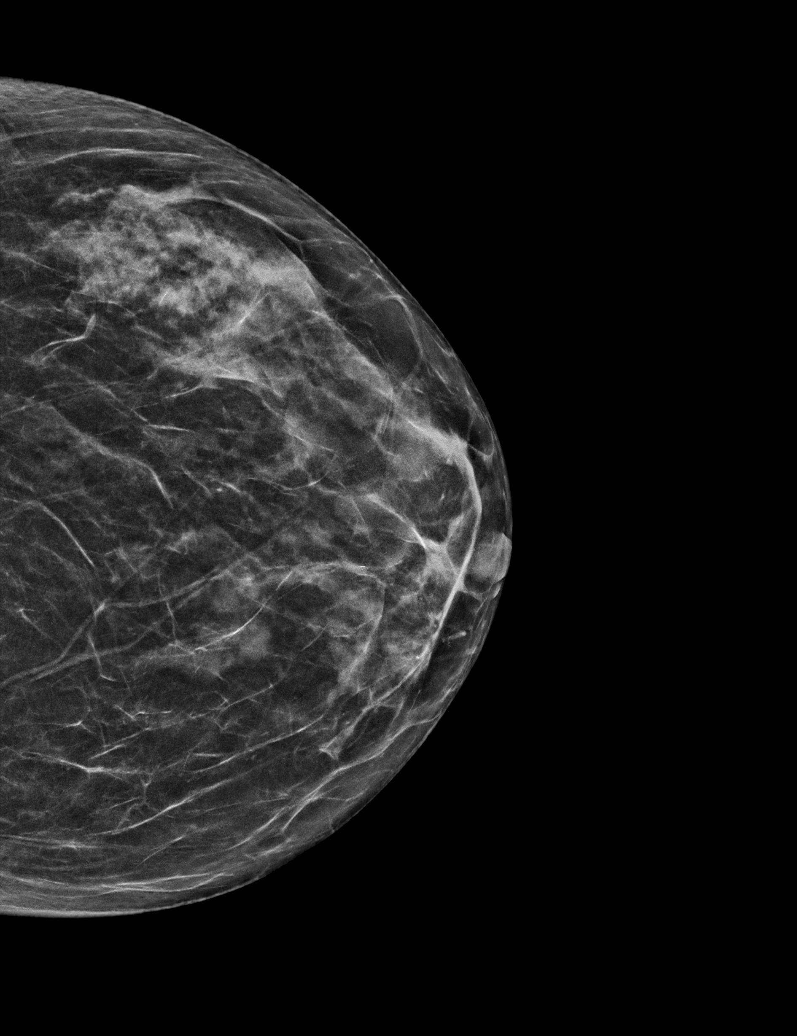

[L MLO tomo · tomo slice 31/60.0]
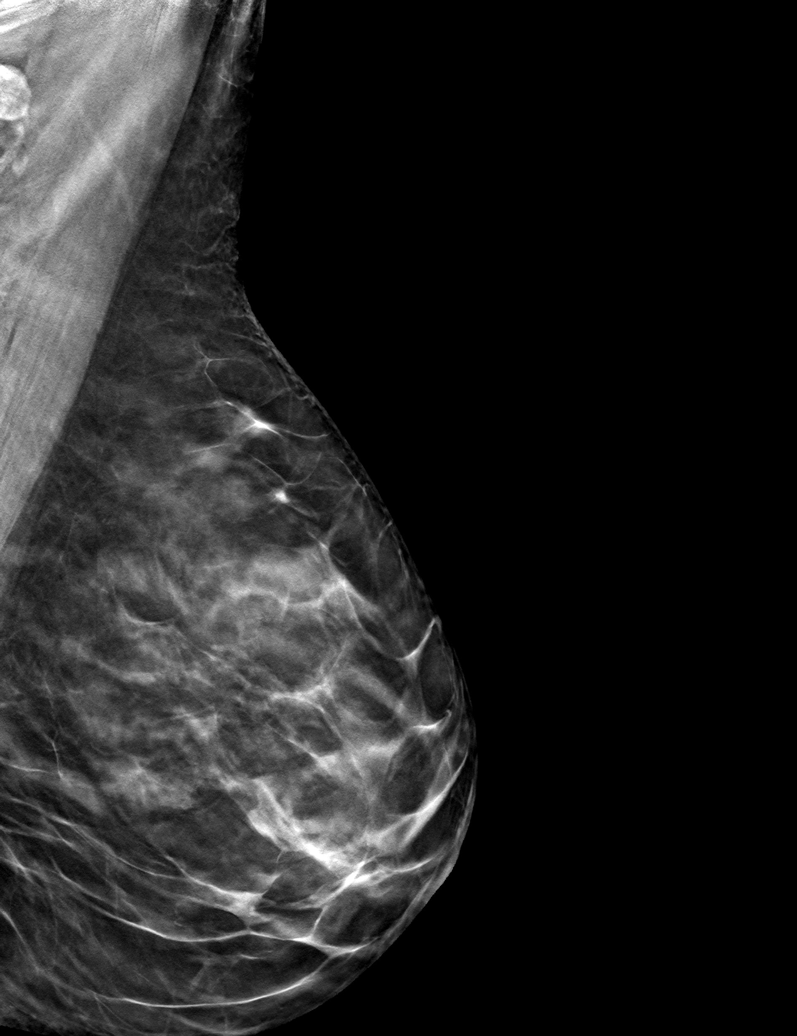

[6 of 30 positions shown; findings below may reference images not displayed]

ACR Breast Density Category c: The breast tissue is heterogeneously
dense, which may obscure small masses.
FINDINGS: There are no findings suspicious for malignancy.
IMPRESSION: No mammographic evidence of malignancy. A result letter of this
screening mammogram will be mailed directly to the patient.

RECOMMENDATION:
Screening mammogram in one year. (Code:Q3-W-BC3)

BI-RADS CATEGORY  1: Negative.

## 2023-07-23 DIAGNOSIS — I10 Essential (primary) hypertension: Secondary | ICD-10-CM | POA: Diagnosis not present

## 2023-07-23 DIAGNOSIS — Z1211 Encounter for screening for malignant neoplasm of colon: Secondary | ICD-10-CM | POA: Diagnosis not present

## 2023-07-23 DIAGNOSIS — G894 Chronic pain syndrome: Secondary | ICD-10-CM | POA: Diagnosis not present

## 2023-07-23 DIAGNOSIS — E782 Mixed hyperlipidemia: Secondary | ICD-10-CM | POA: Diagnosis not present

## 2023-07-23 DIAGNOSIS — Z23 Encounter for immunization: Secondary | ICD-10-CM | POA: Diagnosis not present

## 2023-07-23 DIAGNOSIS — Z0000 Encounter for general adult medical examination without abnormal findings: Secondary | ICD-10-CM | POA: Diagnosis not present

## 2023-07-23 DIAGNOSIS — F17210 Nicotine dependence, cigarettes, uncomplicated: Secondary | ICD-10-CM | POA: Diagnosis not present

## 2023-07-23 DIAGNOSIS — F329 Major depressive disorder, single episode, unspecified: Secondary | ICD-10-CM | POA: Diagnosis not present

## 2023-07-23 DIAGNOSIS — Z13220 Encounter for screening for lipoid disorders: Secondary | ICD-10-CM | POA: Diagnosis not present

## 2023-07-23 DIAGNOSIS — M810 Age-related osteoporosis without current pathological fracture: Secondary | ICD-10-CM | POA: Diagnosis not present

## 2023-08-07 DIAGNOSIS — Z85118 Personal history of other malignant neoplasm of bronchus and lung: Secondary | ICD-10-CM | POA: Diagnosis not present

## 2023-08-07 DIAGNOSIS — Z1331 Encounter for screening for depression: Secondary | ICD-10-CM | POA: Diagnosis not present

## 2023-08-07 DIAGNOSIS — Z0000 Encounter for general adult medical examination without abnormal findings: Secondary | ICD-10-CM | POA: Diagnosis not present

## 2023-08-07 DIAGNOSIS — M545 Low back pain, unspecified: Secondary | ICD-10-CM | POA: Diagnosis not present

## 2023-08-07 DIAGNOSIS — Z1339 Encounter for screening examination for other mental health and behavioral disorders: Secondary | ICD-10-CM | POA: Diagnosis not present

## 2023-08-07 DIAGNOSIS — M25571 Pain in right ankle and joints of right foot: Secondary | ICD-10-CM | POA: Diagnosis not present

## 2023-08-07 DIAGNOSIS — Z79899 Other long term (current) drug therapy: Secondary | ICD-10-CM | POA: Diagnosis not present

## 2023-08-07 DIAGNOSIS — F17210 Nicotine dependence, cigarettes, uncomplicated: Secondary | ICD-10-CM | POA: Diagnosis not present

## 2023-08-07 DIAGNOSIS — Z6821 Body mass index (BMI) 21.0-21.9, adult: Secondary | ICD-10-CM | POA: Diagnosis not present

## 2023-08-17 DIAGNOSIS — M25511 Pain in right shoulder: Secondary | ICD-10-CM | POA: Diagnosis not present

## 2023-08-17 DIAGNOSIS — K5900 Constipation, unspecified: Secondary | ICD-10-CM | POA: Diagnosis not present

## 2023-09-01 DIAGNOSIS — Z79899 Other long term (current) drug therapy: Secondary | ICD-10-CM | POA: Diagnosis not present

## 2023-09-01 DIAGNOSIS — M25571 Pain in right ankle and joints of right foot: Secondary | ICD-10-CM | POA: Diagnosis not present

## 2023-09-01 DIAGNOSIS — M545 Low back pain, unspecified: Secondary | ICD-10-CM | POA: Diagnosis not present

## 2023-09-01 DIAGNOSIS — F17210 Nicotine dependence, cigarettes, uncomplicated: Secondary | ICD-10-CM | POA: Diagnosis not present

## 2023-09-01 DIAGNOSIS — Z6821 Body mass index (BMI) 21.0-21.9, adult: Secondary | ICD-10-CM | POA: Diagnosis not present

## 2023-09-01 DIAGNOSIS — Z85118 Personal history of other malignant neoplasm of bronchus and lung: Secondary | ICD-10-CM | POA: Diagnosis not present

## 2023-09-01 DIAGNOSIS — J42 Unspecified chronic bronchitis: Secondary | ICD-10-CM | POA: Diagnosis not present

## 2023-09-14 ENCOUNTER — Encounter: Payer: Self-pay | Admitting: Physician Assistant

## 2023-09-21 DIAGNOSIS — M25571 Pain in right ankle and joints of right foot: Secondary | ICD-10-CM | POA: Diagnosis not present

## 2023-09-21 DIAGNOSIS — Z6821 Body mass index (BMI) 21.0-21.9, adult: Secondary | ICD-10-CM | POA: Diagnosis not present

## 2023-09-21 DIAGNOSIS — F17210 Nicotine dependence, cigarettes, uncomplicated: Secondary | ICD-10-CM | POA: Diagnosis not present

## 2023-09-21 DIAGNOSIS — M545 Low back pain, unspecified: Secondary | ICD-10-CM | POA: Diagnosis not present

## 2023-09-21 DIAGNOSIS — Z79899 Other long term (current) drug therapy: Secondary | ICD-10-CM | POA: Diagnosis not present

## 2023-09-23 ENCOUNTER — Encounter: Payer: Self-pay | Admitting: Internal Medicine

## 2023-09-27 DIAGNOSIS — Z79899 Other long term (current) drug therapy: Secondary | ICD-10-CM | POA: Diagnosis not present

## 2023-10-20 DIAGNOSIS — Z79899 Other long term (current) drug therapy: Secondary | ICD-10-CM | POA: Diagnosis not present

## 2023-10-20 DIAGNOSIS — Z85118 Personal history of other malignant neoplasm of bronchus and lung: Secondary | ICD-10-CM | POA: Diagnosis not present

## 2023-10-20 DIAGNOSIS — F17210 Nicotine dependence, cigarettes, uncomplicated: Secondary | ICD-10-CM | POA: Diagnosis not present

## 2023-10-20 DIAGNOSIS — M25571 Pain in right ankle and joints of right foot: Secondary | ICD-10-CM | POA: Diagnosis not present

## 2023-10-20 DIAGNOSIS — M545 Low back pain, unspecified: Secondary | ICD-10-CM | POA: Diagnosis not present

## 2023-10-20 DIAGNOSIS — Z6821 Body mass index (BMI) 21.0-21.9, adult: Secondary | ICD-10-CM | POA: Diagnosis not present

## 2023-11-19 DIAGNOSIS — Z85118 Personal history of other malignant neoplasm of bronchus and lung: Secondary | ICD-10-CM | POA: Diagnosis not present

## 2023-11-19 DIAGNOSIS — M25571 Pain in right ankle and joints of right foot: Secondary | ICD-10-CM | POA: Diagnosis not present

## 2023-11-19 DIAGNOSIS — Z79899 Other long term (current) drug therapy: Secondary | ICD-10-CM | POA: Diagnosis not present

## 2023-11-19 DIAGNOSIS — Z6820 Body mass index (BMI) 20.0-20.9, adult: Secondary | ICD-10-CM | POA: Diagnosis not present

## 2023-11-19 DIAGNOSIS — M545 Low back pain, unspecified: Secondary | ICD-10-CM | POA: Diagnosis not present

## 2023-11-19 DIAGNOSIS — F17210 Nicotine dependence, cigarettes, uncomplicated: Secondary | ICD-10-CM | POA: Diagnosis not present

## 2023-12-06 ENCOUNTER — Ambulatory Visit: Payer: Medicare HMO | Admitting: Physician Assistant

## 2023-12-06 NOTE — Progress Notes (Deleted)
 12/06/2023 Luevenia Maxin [id number] 09/25/1951  Referring provider: Marinda Elk, MD Primary GI doctor: Dr. Rhea Belton  ASSESSMENT AND PLAN:   Assessment and Plan              Patient Care Team: Marinda Elk, MD as PCP - General (Family Medicine)  HISTORY OF PRESENT ILLNESS: 73 y.o. female with a past medical history of ***and others listed below presents for evaluation of ***.   Discussed the use of AI scribe software for clinical note transcription with the patient, who gave verbal consent to proceed.  History of Present Illness             She  reports that she has been smoking cigarettes. She has a 104 pack-year smoking history. She has never used smokeless tobacco. She reports that she does not drink alcohol and does not use drugs.  RELEVANT GI HISTORY, LABS, IMAGING: 09/19/2018 colonoscopy Dr. Rhea Belton for personal history of colon polyps- Recall 5 years, 09/2023 - Two 3 to 6 mm polyps in the descending colon and at the hepatic flexure, removed with a cold snare. Resected and retrieved. - Diverticulosis in the sigmoid colon, in the descending colon, in the transverse colon and in the ascending colon. - Internal hemorrhoids. Surgical [P], transverse, descending, polyp (2) - TUBULAR ADENOMA(S) WITHOUT HIGH-GRADE DYSPLASIA OR MALIGNANCY - HYPERPLASTIC POLYP(S) CBC    Component Value Date/Time   WBC 9.0 01/16/2019 0549   RBC 4.18 01/16/2019 0549   HGB 13.3 01/16/2019 0549   HCT 41.4 01/16/2019 0549   PLT 248 01/16/2019 0549   MCV 99.0 01/16/2019 0549   MCH 31.8 01/16/2019 0549   MCHC 32.1 01/16/2019 0549   RDW 13.1 01/16/2019 0549   LYMPHSABS 1.5 01/15/2019 0126   MONOABS 0.8 01/15/2019 0126   EOSABS 0.0 01/15/2019 0126   BASOSABS 0.0 01/15/2019 0126   No results for input(s): "HGB" in the last 8760 hours.  CMP     Component Value Date/Time   NA 139 01/16/2019 0549   K 4.3 01/16/2019 0549   CL 105 01/16/2019 0549   CO2 26 01/16/2019 0549    GLUCOSE 94 01/16/2019 0549   BUN 12 01/16/2019 0549   CREATININE 0.83 01/16/2019 0549   CALCIUM 9.6 01/16/2019 0549   PROT 7.6 01/15/2019 0126   ALBUMIN 3.9 01/15/2019 0126   AST 17 01/15/2019 0126   ALT 7 01/15/2019 0126   ALKPHOS 71 01/15/2019 0126   BILITOT 0.8 01/15/2019 0126   GFRNONAA >60 01/16/2019 0549   GFRAA >60 01/16/2019 0549      Latest Ref Rng & Units 01/15/2019    1:26 AM 09/01/2010    9:00 PM  Hepatic Function  Total Protein 6.5 - 8.1 g/dL 7.6  6.9   Albumin 3.5 - 5.0 g/dL 3.9  4.0   AST 15 - 41 U/L 17  19   ALT 0 - 44 U/L 7  10   Alk Phosphatase 38 - 126 U/L 71  79   Total Bilirubin 0.3 - 1.2 mg/dL 0.8  0.4       Current Medications:   Current Outpatient Medications (Endocrine & Metabolic):    alendronate (FOSAMAX) 70 MG tablet, Take 70 mg by mouth once a week. Take with a full glass of water on an empty stomach.    Current Outpatient Medications (Analgesics):    HYDROcodone-acetaminophen (NORCO/VICODIN) 5-325 MG tablet, Take 1 tablet by mouth every 8 (eight) hours as needed for moderate pain.   Current Outpatient  Medications (Other):    ALPRAZolam (XANAX) 1 MG tablet, Take 1 tablet (1 mg total) by mouth at bedtime.   amitriptyline (ELAVIL) 25 MG tablet, Take 50 mg by mouth at bedtime.   DULoxetine (CYMBALTA) 60 MG capsule, Take 60 mg by mouth daily.   gabapentin (NEURONTIN) 300 MG capsule, Take 1 capsule (300 mg total) by mouth 3 (three) times daily.   NARCAN 4 MG/0.1ML LIQD nasal spray kit, Place 1 Dose into the nose as directed.   Suvorexant (BELSOMRA) 5 MG TABS, Take 5 mg by mouth at bedtime as needed.  Medical History:  Past Medical History:  Diagnosis Date   Anxiety    Arthritis    Osteoporosis    Sleep apnea 2006   bipap used 2 yrs ago   Allergies:  Allergies  Allergen Reactions   Nsaids Nausea Only     Surgical History:  She  has a past surgical history that includes Colonoscopy (2011); Polypectomy; Ankle surgery (2014); Finger  surgery (1985); and Abdominal hysterectomy. Family History:  Her family history includes Colon cancer (age of onset: 60) in her mother.  REVIEW OF SYSTEMS  : All other systems reviewed and negative except where noted in the History of Present Illness.  PHYSICAL EXAM: There were no vitals taken for this visit. General Appearance: Well nourished, in no apparent distress. Head:   Normocephalic and atraumatic. Eyes:  sclerae anicteric,conjunctive pink  Respiratory: Respiratory effort normal, BS equal bilaterally without rales, rhonchi, wheezing. Cardio: RRR with no MRGs. Peripheral pulses intact.  Abdomen: Soft,  {BlankSingle:19197::"Flat","Obese","Non-distended"} ,active bowel sounds. {actendernessAB:27319} tenderness {anatomy; site abdomen:5010}. {BlankMultiple:19196::"Without guarding","With guarding","Without rebound","With rebound"}. No masses. Rectal: {acrectalexam:27461} Musculoskeletal: Full ROM, {PSY - GAIT AND STATION:22860} gait. {With/Without:304960234} edema. Skin:  Dry and intact without significant lesions or rashes Neuro: Alert and  oriented x4;  No focal deficits. Psych:  Cooperative. Normal mood and affect.    Doree Albee, PA-C 8:31 AM

## 2023-12-09 DIAGNOSIS — C3411 Malignant neoplasm of upper lobe, right bronchus or lung: Secondary | ICD-10-CM | POA: Diagnosis not present

## 2023-12-09 DIAGNOSIS — J439 Emphysema, unspecified: Secondary | ICD-10-CM | POA: Diagnosis not present

## 2023-12-09 DIAGNOSIS — Z902 Acquired absence of lung [part of]: Secondary | ICD-10-CM | POA: Diagnosis not present

## 2023-12-09 DIAGNOSIS — R918 Other nonspecific abnormal finding of lung field: Secondary | ICD-10-CM | POA: Diagnosis not present

## 2023-12-15 DIAGNOSIS — Z902 Acquired absence of lung [part of]: Secondary | ICD-10-CM | POA: Diagnosis not present

## 2023-12-15 DIAGNOSIS — C3411 Malignant neoplasm of upper lobe, right bronchus or lung: Secondary | ICD-10-CM | POA: Diagnosis not present

## 2023-12-16 DIAGNOSIS — C3411 Malignant neoplasm of upper lobe, right bronchus or lung: Secondary | ICD-10-CM | POA: Diagnosis not present

## 2023-12-16 DIAGNOSIS — Z902 Acquired absence of lung [part of]: Secondary | ICD-10-CM | POA: Diagnosis not present

## 2023-12-17 DIAGNOSIS — M25571 Pain in right ankle and joints of right foot: Secondary | ICD-10-CM | POA: Diagnosis not present

## 2023-12-17 DIAGNOSIS — F17210 Nicotine dependence, cigarettes, uncomplicated: Secondary | ICD-10-CM | POA: Diagnosis not present

## 2023-12-17 DIAGNOSIS — Z79899 Other long term (current) drug therapy: Secondary | ICD-10-CM | POA: Diagnosis not present

## 2023-12-17 DIAGNOSIS — Z6820 Body mass index (BMI) 20.0-20.9, adult: Secondary | ICD-10-CM | POA: Diagnosis not present

## 2023-12-17 DIAGNOSIS — M545 Low back pain, unspecified: Secondary | ICD-10-CM | POA: Diagnosis not present

## 2023-12-17 DIAGNOSIS — Z85118 Personal history of other malignant neoplasm of bronchus and lung: Secondary | ICD-10-CM | POA: Diagnosis not present

## 2024-01-19 NOTE — Progress Notes (Unsigned)
 01/20/2024 Rebekah Chapman [id number] Mar 28, 1951  Referring provider: Marinda Elk, MD Primary GI doctor: Dr. Rhea Belton  ASSESSMENT AND PLAN:   Personal history of polyps/mother with colon cancer 09/21/2018 colonoscopy 2 polyps 3 to 6 mm, diverticulosis, hemorrhoids recall 2024 We have discussed the risks of bleeding, infection, perforation, medication reactions, and remote risk of death associated with colonoscopy. All questions were answered and the patient acknowledges these risk and wishes to proceed.  Opioid induced Constipation Has BM once a week, denies hematochezia, AB pain Linzess was too expensive, miralax does better - Increase fiber/ water intake, decrease caffeine, increase activity level. -Will add on Miralax twice a day - can consider amitiza if miralax does not hlep - will do 2 day prep  Macrocytosis/B12 def HGB 12.6, MCV 101 Denies alcohol use On B12, likely improving anemia from B12 def  History of adenocarcinoma of upper right lobe Follows with [facility] S/p VATS 12/24/21  COPD/OSA overlap On CPAP Stop smoking  Tobacco use Discussed risks associated with tobacco use and advised to quit. Information given to the patient, discuss with PCP.   Patient Care Team: Marinda Elk, MD as PCP - General (Family Medicine)  HISTORY OF PRESENT ILLNESS: Discussed the use of AI scribe software for clinical note transcription with the patient, who gave verbal consent to proceed.  History of Present Illness   Rebekah Chapman "Rebekah Chapman" is a 73 year old female who presents for a follow-up regarding constipation and opioid use, patient is due for recall colon for history of adenomatous polyps.  She experiences constipation, which she attributes to her opioid use for chronic back pain and a previous ankle injury. Bowel movements occur once or twice a week and are improved with MiraLAX. She has previously used stool softeners and occasionally enemas, but  finds MiraLAX more effective. Linzess has not been tried due to cost. No abdominal pain or blood in the stool.  She has a history of lung cancer surgery two years ago and has experienced weight fluctuations since then, particularly after separating from her husband. Some weight loss is attributed to decreased appetite due to depression and inactivity. Her family history is significant for colon cancer, as her mother had colon cancer and wore a colostomy bag for over thirty years, prompting regular colonoscopies.  She has a history of COPD and uses a CPAP machine since 1999. She experiences shortness of breath, particularly in cooler weather, but no increased mucus production or recent exacerbations. She continues to smoke cigarettes but denies alcohol or drug use.  She is currently separated from her husband, has not worked since August of last year, and is experiencing depression. She takes B12 and D3 supplements and reports no issues with swallowing, nausea, or vomiting.    She  reports that she has been smoking cigarettes. She has a 104 pack-year smoking history. She has never used smokeless tobacco. She reports that she does not drink alcohol and does not use drugs.  RELEVANT GI HISTORY, IMAGING AND LABS: Results   PATHOLOGY Lung resection: Upper lobe resection (01/19/2022)      CBC    Component Value Date/Time   WBC 9.0 01/16/2019 0549   RBC 4.18 01/16/2019 0549   HGB 13.3 01/16/2019 0549   HCT 41.4 01/16/2019 0549   PLT 248 01/16/2019 0549   MCV 99.0 01/16/2019 0549   MCH 31.8 01/16/2019 0549   MCHC 32.1 01/16/2019 0549   RDW 13.1 01/16/2019 0549   LYMPHSABS 1.5 01/15/2019  0126   MONOABS 0.8 01/15/2019 0126   EOSABS 0.0 01/15/2019 0126   BASOSABS 0.0 01/15/2019 0126   No results for input(s): "HGB" in the last 8760 hours.  CMP     Component Value Date/Time   NA 139 01/16/2019 0549   K 4.3 01/16/2019 0549   CL 105 01/16/2019 0549   CO2 26 01/16/2019 0549   GLUCOSE 94  01/16/2019 0549   BUN 12 01/16/2019 0549   CREATININE 0.83 01/16/2019 0549   CALCIUM 9.6 01/16/2019 0549   PROT 7.6 01/15/2019 0126   ALBUMIN 3.9 01/15/2019 0126   AST 17 01/15/2019 0126   ALT 7 01/15/2019 0126   ALKPHOS 71 01/15/2019 0126   BILITOT 0.8 01/15/2019 0126   GFRNONAA >60 01/16/2019 0549   GFRAA >60 01/16/2019 0549      Latest Ref Rng & Units 01/15/2019    1:26 AM 09/01/2010    9:00 PM  Hepatic Function  Total Protein 6.5 - 8.1 g/dL 7.6  6.9   Albumin 3.5 - 5.0 g/dL 3.9  4.0   AST 15 - 41 U/L 17  19   ALT 0 - 44 U/L 7  10   Alk Phosphatase 38 - 126 U/L 71  79   Total Bilirubin 0.3 - 1.2 mg/dL 0.8  0.4       Current Medications:     Current Outpatient Medications (Respiratory):    cetirizine (ZYRTEC) 10 MG tablet, Take 10 mg by mouth as needed.   fluticasone (FLONASE) 50 MCG/ACT nasal spray, Place 2 sprays into both nostrils as needed.  Current Outpatient Medications (Analgesics):    oxyCODONE-acetaminophen (PERCOCET) 10-325 MG tablet, Take 1 tablet by mouth 3 (three) times daily as needed.  Current Outpatient Medications (Hematological):    cyanocobalamin (VITAMIN B12) 1000 MCG tablet, Take 1,000 mcg by mouth daily.  Current Outpatient Medications (Other):    ALPRAZolam (XANAX) 1 MG tablet, Take 1 tablet (1 mg total) by mouth at bedtime.   calcium carbonate (OS-CAL) 600 MG TABS tablet, Take 600 mg by mouth every other day.   Cholecalciferol (VITAMIN D3) 25 MCG (1000 UT) CAPS, Take 1,000 Units by mouth daily.   DULoxetine (CYMBALTA) 60 MG capsule, Take 60 mg by mouth daily.   famotidine (PEPCID) 20 MG tablet, Take 20 mg by mouth as needed.   gabapentin (NEURONTIN) 300 MG capsule, Take 1 capsule (300 mg total) by mouth 3 (three) times daily.   Misc. Devices MISC, New Bipap machine with mask and supplies per patient preference for OSA. Bipap therapy at 25/17 cm. water pressure. Send to the current DME [facility].   NARCAN 4 MG/0.1ML LIQD nasal spray kit, Place 1  Dose into the nose as directed. (Patient not taking: Reported on 01/20/2024)  Medical History:  Past Medical History:  Diagnosis Date   Anxiety    Arthritis    Osteoporosis    Sleep apnea 2006   bipap used 2 yrs ago   Allergies:  Allergies  Allergen Reactions   Suvorexant Other (See Comments)   Ibuprofen Other (See Comments)   Nsaids Nausea Only     Surgical History:  She  has a past surgical history that includes Colonoscopy (2011); Polypectomy; Ankle surgery (2014); Finger surgery (1985); and Abdominal hysterectomy. Family History:  Her family history includes Colon cancer (age of onset: 55) in her mother.  REVIEW OF SYSTEMS  : All other systems reviewed and negative except where noted in the History of Present Illness.  PHYSICAL EXAM: BP Marland Kitchen)  142/80 (BP Location: Right Arm, Patient Position: Sitting)   Pulse 76   Ht 5\' 1"  (1.549 m)   Wt 107 lb 9.6 oz (48.8 kg)   BMI 20.33 kg/m  Physical Exam   MEASUREMENTS: Weight- 107. GENERAL APPEARANCE: Well nourished, in no apparent distress. HEENT: No cervical lymphadenopathy, unremarkable thyroid, sclerae anicteric, conjunctiva pink. RESPIRATORY: Respiratory effort normal, breath sounds diffusely decreased due to COPD, equal bilateral without rales, rhonchi, or wheezing. CARDIO: Regular rate and rhythm with no murmurs, rubs, or gallops, peripheral pulses intact. ABDOMEN: Soft, non-distended, active bowel sounds in all four quadrants, no tenderness to palpation, no rebound, no mass appreciated, no abdominal pain. RECTAL: Declines. MUSCULOSKELETAL: Full range of motion, normal gait, without edema. SKIN: Dry, intact without rashes or lesions. No jaundice. NEURO: Alert, oriented, no focal deficits. PSYCH: Cooperative, normal mood and affect.      Doree Albee, PA-C 10:19 AM

## 2024-01-20 ENCOUNTER — Ambulatory Visit: Payer: Medicare HMO | Admitting: Physician Assistant

## 2024-01-20 ENCOUNTER — Encounter: Payer: Self-pay | Admitting: Physician Assistant

## 2024-01-20 VITALS — BP 142/80 | HR 76 | Ht 61.0 in | Wt 107.6 lb

## 2024-01-20 DIAGNOSIS — D7589 Other specified diseases of blood and blood-forming organs: Secondary | ICD-10-CM | POA: Diagnosis not present

## 2024-01-20 DIAGNOSIS — E538 Deficiency of other specified B group vitamins: Secondary | ICD-10-CM | POA: Diagnosis not present

## 2024-01-20 DIAGNOSIS — Z85118 Personal history of other malignant neoplasm of bronchus and lung: Secondary | ICD-10-CM

## 2024-01-20 DIAGNOSIS — F17200 Nicotine dependence, unspecified, uncomplicated: Secondary | ICD-10-CM

## 2024-01-20 DIAGNOSIS — J449 Chronic obstructive pulmonary disease, unspecified: Secondary | ICD-10-CM

## 2024-01-20 DIAGNOSIS — F17210 Nicotine dependence, cigarettes, uncomplicated: Secondary | ICD-10-CM

## 2024-01-20 DIAGNOSIS — Z860101 Personal history of adenomatous and serrated colon polyps: Secondary | ICD-10-CM

## 2024-01-20 DIAGNOSIS — K5903 Drug induced constipation: Secondary | ICD-10-CM

## 2024-01-20 DIAGNOSIS — Z800 Family history of malignant neoplasm of digestive organs: Secondary | ICD-10-CM

## 2024-01-20 DIAGNOSIS — T402X5A Adverse effect of other opioids, initial encounter: Secondary | ICD-10-CM | POA: Diagnosis not present

## 2024-01-20 DIAGNOSIS — G4733 Obstructive sleep apnea (adult) (pediatric): Secondary | ICD-10-CM

## 2024-01-20 MED ORDER — SUTAB 1479-225-188 MG PO TABS: ORAL_TABLET | ORAL | 0 refills | Status: AC

## 2024-01-20 NOTE — Patient Instructions (Addendum)
 Miralax is an osmotic laxative.  It only brings more water into the stool.  This is safe to take daily.  Can take up to 17 gram of miralax twice a day.  Mix with juice or coffee.  Start 1 capful at night for 3-4 days and reassess your response in 3-4 days.  You can increase and decrease the dose based on your response.  Remember, it can take up to 3-4 days to take effect OR for the effects to wear off.   I often pair this with benefiber in the morning to help assure the stool is not too loose.   You have been scheduled for a colonoscopy. Please follow written instructions given to you at your visit today.   If you use inhalers (even only as needed), please bring them with you on the day of your procedure.  DO NOT TAKE 7 DAYS PRIOR TO TEST- Trulicity (dulaglutide) Ozempic, Wegovy (semaglutide) Mounjaro (tirzepatide) Bydureon Bcise (exanatide extended release)  DO NOT TAKE 1 DAY PRIOR TO YOUR TEST Rybelsus (semaglutide) Adlyxin (lixisenatide) Victoza (liraglutide) Byetta (exanatide) ___________________________________________________________________________  Bonita Quin will receive your bowel preparation through [facility], which ensures the lowest copay and home delivery, with outreach via text or call from an [phone] number. Please respond promptly to avoid rescheduling of your procedure. If you are interested in alternative options or have any questions regarding your prep, please contact them at [phone] ____________________________________________________________________________  Your Provider Has Sent Your Bowel Prep Regimen To [facility]   [facility] will contact you to verify your information and collect your copay, if applicable. Enjoy the comfort of your home while your prescription is mailed to you, FREE of any shipping charges.   [facility] accepts all major insurance benefits and applies discounts & coupons.  Have additional questions?   Chat: [email or web address] Call:  [phone] Email: [email or web address] NCPDP: [id number]  How will [facility] contact you?  With a Welcome phone call,  a Welcome text and a checkout link in text form.  Texts you receive from [phone] Are NOT Spam.  *To set up delivery, you must complete the checkout process via link or speak to one of the patient care representatives. If [facility] is unable to reach you, your prescription may be delayed.  To avoid long hold times on the phone, you may also utilize the secure chat feature on the [facility] website to request that they call you back for transaction completion or to expedite your concerns.    Thank you for trusting me with your gastrointestinal care!   Quentin Mulling, PA-C   Toileting tips to help with your constipation - Drink at least 64-80 ounces of water/liquid per day. - Establish a time to try to move your bowels every day.  For many people, this is after a cup of coffee or after a meal such as breakfast. - Sit all of the way back on the toilet keeping your back fairly straight and while sitting up, try to rest the tops of your forearms on your upper thighs.   - Raising your feet with a step stool/squatty potty can be helpful to improve the angle that allows your stool to pass through the rectum. - Relax the rectum feeling it bulge toward the toilet water.  If you feel your rectum raising toward your body, you are contracting rather than relaxing. - Breathe in and slowly exhale. "Belly breath" by expanding your belly towards your belly button. Keep belly expanded as you gently direct pressure down and back to the  anus.  A low pitched GRRR sound can assist with increasing intra-abdominal pressure.  (Can also trying to blow on a pinwheel and make it move, this helps with the same belly breathing) - Repeat 3-4 times. If unsuccessful, contract the pelvic floor to restore normal tone and get off the toilet.  Avoid excessive straining. - To reduce  excessive wiping by teaching your anus to normally contract, place hands on outer aspect of knees and resist knee movement outward.  Hold 5-10 second then place hands just inside of knees and resist inward movement of knees.  Hold 5 seconds.  Repeat a few times each way.  Go to the ER if unable to pass gas, severe AB pain, unable to hold down food, any shortness of breath of chest pain.

## 2024-02-08 DIAGNOSIS — R0602 Shortness of breath: Secondary | ICD-10-CM | POA: Diagnosis not present

## 2024-02-08 DIAGNOSIS — M25571 Pain in right ankle and joints of right foot: Secondary | ICD-10-CM | POA: Diagnosis not present

## 2024-02-08 DIAGNOSIS — F17210 Nicotine dependence, cigarettes, uncomplicated: Secondary | ICD-10-CM | POA: Diagnosis not present

## 2024-02-08 DIAGNOSIS — Z0000 Encounter for general adult medical examination without abnormal findings: Secondary | ICD-10-CM | POA: Diagnosis not present

## 2024-02-08 DIAGNOSIS — Z6820 Body mass index (BMI) 20.0-20.9, adult: Secondary | ICD-10-CM | POA: Diagnosis not present

## 2024-02-08 DIAGNOSIS — M7989 Other specified soft tissue disorders: Secondary | ICD-10-CM | POA: Diagnosis not present

## 2024-02-08 DIAGNOSIS — M545 Low back pain, unspecified: Secondary | ICD-10-CM | POA: Diagnosis not present

## 2024-02-08 DIAGNOSIS — Z79899 Other long term (current) drug therapy: Secondary | ICD-10-CM | POA: Diagnosis not present

## 2024-02-08 DIAGNOSIS — K5909 Other constipation: Secondary | ICD-10-CM | POA: Diagnosis not present

## 2024-02-25 ENCOUNTER — Encounter: Payer: Self-pay | Admitting: Internal Medicine

## 2024-02-29 DIAGNOSIS — M25571 Pain in right ankle and joints of right foot: Secondary | ICD-10-CM | POA: Diagnosis not present

## 2024-02-29 DIAGNOSIS — K5909 Other constipation: Secondary | ICD-10-CM | POA: Diagnosis not present

## 2024-02-29 DIAGNOSIS — Z85118 Personal history of other malignant neoplasm of bronchus and lung: Secondary | ICD-10-CM | POA: Diagnosis not present

## 2024-02-29 DIAGNOSIS — Z79899 Other long term (current) drug therapy: Secondary | ICD-10-CM | POA: Diagnosis not present

## 2024-02-29 DIAGNOSIS — Z6821 Body mass index (BMI) 21.0-21.9, adult: Secondary | ICD-10-CM | POA: Diagnosis not present

## 2024-02-29 DIAGNOSIS — J449 Chronic obstructive pulmonary disease, unspecified: Secondary | ICD-10-CM | POA: Diagnosis not present

## 2024-02-29 DIAGNOSIS — M545 Low back pain, unspecified: Secondary | ICD-10-CM | POA: Diagnosis not present

## 2024-02-29 DIAGNOSIS — F17210 Nicotine dependence, cigarettes, uncomplicated: Secondary | ICD-10-CM | POA: Diagnosis not present

## 2024-03-15 ENCOUNTER — Encounter: Admitting: Internal Medicine

## 2024-03-20 DIAGNOSIS — R519 Headache, unspecified: Secondary | ICD-10-CM | POA: Diagnosis not present

## 2024-03-20 DIAGNOSIS — K5909 Other constipation: Secondary | ICD-10-CM | POA: Diagnosis not present

## 2024-03-20 DIAGNOSIS — Z6821 Body mass index (BMI) 21.0-21.9, adult: Secondary | ICD-10-CM | POA: Diagnosis not present

## 2024-03-20 DIAGNOSIS — J449 Chronic obstructive pulmonary disease, unspecified: Secondary | ICD-10-CM | POA: Diagnosis not present

## 2024-03-20 DIAGNOSIS — J329 Chronic sinusitis, unspecified: Secondary | ICD-10-CM | POA: Diagnosis not present

## 2024-03-20 DIAGNOSIS — Z79899 Other long term (current) drug therapy: Secondary | ICD-10-CM | POA: Diagnosis not present

## 2024-03-20 DIAGNOSIS — M25571 Pain in right ankle and joints of right foot: Secondary | ICD-10-CM | POA: Diagnosis not present

## 2024-03-20 DIAGNOSIS — M545 Low back pain, unspecified: Secondary | ICD-10-CM | POA: Diagnosis not present

## 2024-03-20 DIAGNOSIS — F17210 Nicotine dependence, cigarettes, uncomplicated: Secondary | ICD-10-CM | POA: Diagnosis not present

## 2024-04-11 DIAGNOSIS — Z6821 Body mass index (BMI) 21.0-21.9, adult: Secondary | ICD-10-CM | POA: Diagnosis not present

## 2024-04-11 DIAGNOSIS — J449 Chronic obstructive pulmonary disease, unspecified: Secondary | ICD-10-CM | POA: Diagnosis not present

## 2024-04-11 DIAGNOSIS — F17210 Nicotine dependence, cigarettes, uncomplicated: Secondary | ICD-10-CM | POA: Diagnosis not present

## 2024-04-11 DIAGNOSIS — Z79899 Other long term (current) drug therapy: Secondary | ICD-10-CM | POA: Diagnosis not present

## 2024-04-11 DIAGNOSIS — M545 Low back pain, unspecified: Secondary | ICD-10-CM | POA: Diagnosis not present

## 2024-04-11 DIAGNOSIS — Z85118 Personal history of other malignant neoplasm of bronchus and lung: Secondary | ICD-10-CM | POA: Diagnosis not present

## 2024-04-11 DIAGNOSIS — K5909 Other constipation: Secondary | ICD-10-CM | POA: Diagnosis not present

## 2024-04-11 DIAGNOSIS — M25571 Pain in right ankle and joints of right foot: Secondary | ICD-10-CM | POA: Diagnosis not present

## 2024-04-26 DIAGNOSIS — Z13220 Encounter for screening for lipoid disorders: Secondary | ICD-10-CM | POA: Diagnosis not present

## 2024-04-26 DIAGNOSIS — Z1211 Encounter for screening for malignant neoplasm of colon: Secondary | ICD-10-CM | POA: Diagnosis not present

## 2024-04-26 DIAGNOSIS — I10 Essential (primary) hypertension: Secondary | ICD-10-CM | POA: Diagnosis not present

## 2024-04-26 DIAGNOSIS — F322 Major depressive disorder, single episode, severe without psychotic features: Secondary | ICD-10-CM | POA: Diagnosis not present

## 2024-04-26 DIAGNOSIS — F17200 Nicotine dependence, unspecified, uncomplicated: Secondary | ICD-10-CM | POA: Diagnosis not present

## 2024-04-26 DIAGNOSIS — M810 Age-related osteoporosis without current pathological fracture: Secondary | ICD-10-CM | POA: Diagnosis not present

## 2024-04-26 DIAGNOSIS — C3491 Malignant neoplasm of unspecified part of right bronchus or lung: Secondary | ICD-10-CM | POA: Diagnosis not present

## 2024-04-26 DIAGNOSIS — G894 Chronic pain syndrome: Secondary | ICD-10-CM | POA: Diagnosis not present

## 2024-04-26 DIAGNOSIS — Z0000 Encounter for general adult medical examination without abnormal findings: Secondary | ICD-10-CM | POA: Diagnosis not present

## 2024-04-27 ENCOUNTER — Other Ambulatory Visit: Payer: Self-pay | Admitting: Family Medicine

## 2024-04-27 DIAGNOSIS — Z1231 Encounter for screening mammogram for malignant neoplasm of breast: Secondary | ICD-10-CM

## 2024-05-01 ENCOUNTER — Encounter

## 2024-05-02 DIAGNOSIS — Z1211 Encounter for screening for malignant neoplasm of colon: Secondary | ICD-10-CM | POA: Diagnosis not present

## 2024-05-03 ENCOUNTER — Ambulatory Visit

## 2024-05-10 ENCOUNTER — Encounter: Admitting: Internal Medicine

## 2024-05-11 DIAGNOSIS — K5909 Other constipation: Secondary | ICD-10-CM | POA: Diagnosis not present

## 2024-05-11 DIAGNOSIS — M545 Low back pain, unspecified: Secondary | ICD-10-CM | POA: Diagnosis not present

## 2024-05-11 DIAGNOSIS — Z6821 Body mass index (BMI) 21.0-21.9, adult: Secondary | ICD-10-CM | POA: Diagnosis not present

## 2024-05-11 DIAGNOSIS — D539 Nutritional anemia, unspecified: Secondary | ICD-10-CM | POA: Diagnosis not present

## 2024-05-11 DIAGNOSIS — J449 Chronic obstructive pulmonary disease, unspecified: Secondary | ICD-10-CM | POA: Diagnosis not present

## 2024-05-11 DIAGNOSIS — F419 Anxiety disorder, unspecified: Secondary | ICD-10-CM | POA: Diagnosis not present

## 2024-05-11 DIAGNOSIS — M25571 Pain in right ankle and joints of right foot: Secondary | ICD-10-CM | POA: Diagnosis not present

## 2024-05-11 DIAGNOSIS — Z13220 Encounter for screening for lipoid disorders: Secondary | ICD-10-CM | POA: Diagnosis not present

## 2024-05-11 DIAGNOSIS — Z131 Encounter for screening for diabetes mellitus: Secondary | ICD-10-CM | POA: Diagnosis not present

## 2024-05-11 DIAGNOSIS — Z79899 Other long term (current) drug therapy: Secondary | ICD-10-CM | POA: Diagnosis not present

## 2024-05-11 DIAGNOSIS — J42 Unspecified chronic bronchitis: Secondary | ICD-10-CM | POA: Diagnosis not present

## 2024-05-11 DIAGNOSIS — F17210 Nicotine dependence, cigarettes, uncomplicated: Secondary | ICD-10-CM | POA: Diagnosis not present

## 2024-05-23 DIAGNOSIS — R519 Headache, unspecified: Secondary | ICD-10-CM | POA: Diagnosis not present

## 2024-05-26 DIAGNOSIS — Z9181 History of falling: Secondary | ICD-10-CM | POA: Diagnosis not present

## 2024-05-26 DIAGNOSIS — Z83719 Family history of colon polyps, unspecified: Secondary | ICD-10-CM | POA: Diagnosis not present

## 2024-05-26 DIAGNOSIS — F17210 Nicotine dependence, cigarettes, uncomplicated: Secondary | ICD-10-CM | POA: Diagnosis not present

## 2024-05-26 DIAGNOSIS — Z800 Family history of malignant neoplasm of digestive organs: Secondary | ICD-10-CM | POA: Diagnosis not present

## 2024-05-26 DIAGNOSIS — Z6821 Body mass index (BMI) 21.0-21.9, adult: Secondary | ICD-10-CM | POA: Diagnosis not present

## 2024-05-26 DIAGNOSIS — R195 Other fecal abnormalities: Secondary | ICD-10-CM | POA: Diagnosis not present

## 2024-05-28 DIAGNOSIS — S52615A Nondisplaced fracture of left ulna styloid process, initial encounter for closed fracture: Secondary | ICD-10-CM | POA: Diagnosis not present

## 2024-05-28 DIAGNOSIS — M5137 Other intervertebral disc degeneration, lumbosacral region with discogenic back pain only: Secondary | ICD-10-CM | POA: Diagnosis not present

## 2024-05-28 DIAGNOSIS — R296 Repeated falls: Secondary | ICD-10-CM | POA: Diagnosis not present

## 2024-05-28 DIAGNOSIS — M47817 Spondylosis without myelopathy or radiculopathy, lumbosacral region: Secondary | ICD-10-CM | POA: Diagnosis not present

## 2024-05-28 DIAGNOSIS — S6292XA Unspecified fracture of left wrist and hand, initial encounter for closed fracture: Secondary | ICD-10-CM | POA: Diagnosis not present

## 2024-05-28 DIAGNOSIS — S52592A Other fractures of lower end of left radius, initial encounter for closed fracture: Secondary | ICD-10-CM | POA: Diagnosis not present

## 2024-05-28 DIAGNOSIS — F17210 Nicotine dependence, cigarettes, uncomplicated: Secondary | ICD-10-CM | POA: Diagnosis not present

## 2024-05-28 DIAGNOSIS — S52502A Unspecified fracture of the lower end of left radius, initial encounter for closed fracture: Secondary | ICD-10-CM | POA: Diagnosis not present

## 2024-05-28 DIAGNOSIS — M545 Low back pain, unspecified: Secondary | ICD-10-CM | POA: Diagnosis not present

## 2024-05-28 DIAGNOSIS — G8929 Other chronic pain: Secondary | ICD-10-CM | POA: Diagnosis not present

## 2024-05-28 DIAGNOSIS — M4186 Other forms of scoliosis, lumbar region: Secondary | ICD-10-CM | POA: Diagnosis not present

## 2024-05-28 DIAGNOSIS — M25512 Pain in left shoulder: Secondary | ICD-10-CM | POA: Diagnosis not present

## 2024-05-28 DIAGNOSIS — S62102A Fracture of unspecified carpal bone, left wrist, initial encounter for closed fracture: Secondary | ICD-10-CM | POA: Diagnosis not present

## 2024-06-01 DIAGNOSIS — S62102A Fracture of unspecified carpal bone, left wrist, initial encounter for closed fracture: Secondary | ICD-10-CM | POA: Diagnosis not present

## 2024-06-07 DIAGNOSIS — F411 Generalized anxiety disorder: Secondary | ICD-10-CM | POA: Diagnosis not present

## 2024-06-07 DIAGNOSIS — F5104 Psychophysiologic insomnia: Secondary | ICD-10-CM | POA: Diagnosis not present

## 2024-06-07 DIAGNOSIS — F32A Depression, unspecified: Secondary | ICD-10-CM | POA: Diagnosis not present

## 2024-06-07 DIAGNOSIS — Z6821 Body mass index (BMI) 21.0-21.9, adult: Secondary | ICD-10-CM | POA: Diagnosis not present

## 2024-06-08 DIAGNOSIS — K5909 Other constipation: Secondary | ICD-10-CM | POA: Diagnosis not present

## 2024-06-08 DIAGNOSIS — M25571 Pain in right ankle and joints of right foot: Secondary | ICD-10-CM | POA: Diagnosis not present

## 2024-06-08 DIAGNOSIS — Z79899 Other long term (current) drug therapy: Secondary | ICD-10-CM | POA: Diagnosis not present

## 2024-06-08 DIAGNOSIS — R195 Other fecal abnormalities: Secondary | ICD-10-CM | POA: Diagnosis not present

## 2024-06-08 DIAGNOSIS — Z6821 Body mass index (BMI) 21.0-21.9, adult: Secondary | ICD-10-CM | POA: Diagnosis not present

## 2024-06-08 DIAGNOSIS — M545 Low back pain, unspecified: Secondary | ICD-10-CM | POA: Diagnosis not present

## 2024-06-08 DIAGNOSIS — J439 Emphysema, unspecified: Secondary | ICD-10-CM | POA: Diagnosis not present

## 2024-06-08 DIAGNOSIS — F17210 Nicotine dependence, cigarettes, uncomplicated: Secondary | ICD-10-CM | POA: Diagnosis not present

## 2024-06-08 DIAGNOSIS — Z85118 Personal history of other malignant neoplasm of bronchus and lung: Secondary | ICD-10-CM | POA: Diagnosis not present

## 2024-06-09 DIAGNOSIS — R918 Other nonspecific abnormal finding of lung field: Secondary | ICD-10-CM | POA: Diagnosis not present

## 2024-06-09 DIAGNOSIS — J8410 Pulmonary fibrosis, unspecified: Secondary | ICD-10-CM | POA: Diagnosis not present

## 2024-06-09 DIAGNOSIS — C3411 Malignant neoplasm of upper lobe, right bronchus or lung: Secondary | ICD-10-CM | POA: Diagnosis not present

## 2024-06-15 DIAGNOSIS — C3411 Malignant neoplasm of upper lobe, right bronchus or lung: Secondary | ICD-10-CM | POA: Diagnosis not present

## 2024-06-15 DIAGNOSIS — Z902 Acquired absence of lung [part of]: Secondary | ICD-10-CM | POA: Diagnosis not present

## 2024-06-16 DIAGNOSIS — Z79899 Other long term (current) drug therapy: Secondary | ICD-10-CM | POA: Diagnosis not present

## 2024-06-19 DIAGNOSIS — E119 Type 2 diabetes mellitus without complications: Secondary | ICD-10-CM | POA: Diagnosis not present

## 2024-06-19 DIAGNOSIS — H524 Presbyopia: Secondary | ICD-10-CM | POA: Diagnosis not present

## 2024-06-22 DIAGNOSIS — S52502D Unspecified fracture of the lower end of left radius, subsequent encounter for closed fracture with routine healing: Secondary | ICD-10-CM | POA: Diagnosis not present

## 2024-06-22 DIAGNOSIS — S62102D Fracture of unspecified carpal bone, left wrist, subsequent encounter for fracture with routine healing: Secondary | ICD-10-CM | POA: Diagnosis not present

## 2024-07-10 DIAGNOSIS — R195 Other fecal abnormalities: Secondary | ICD-10-CM | POA: Diagnosis not present

## 2024-07-10 DIAGNOSIS — J439 Emphysema, unspecified: Secondary | ICD-10-CM | POA: Diagnosis not present

## 2024-07-10 DIAGNOSIS — Z79899 Other long term (current) drug therapy: Secondary | ICD-10-CM | POA: Diagnosis not present

## 2024-07-10 DIAGNOSIS — M810 Age-related osteoporosis without current pathological fracture: Secondary | ICD-10-CM | POA: Diagnosis not present

## 2024-07-10 DIAGNOSIS — E611 Iron deficiency: Secondary | ICD-10-CM | POA: Diagnosis not present

## 2024-07-10 DIAGNOSIS — Z6821 Body mass index (BMI) 21.0-21.9, adult: Secondary | ICD-10-CM | POA: Diagnosis not present

## 2024-07-10 DIAGNOSIS — K5909 Other constipation: Secondary | ICD-10-CM | POA: Diagnosis not present

## 2024-07-10 DIAGNOSIS — F17210 Nicotine dependence, cigarettes, uncomplicated: Secondary | ICD-10-CM | POA: Diagnosis not present

## 2024-07-10 DIAGNOSIS — M545 Low back pain, unspecified: Secondary | ICD-10-CM | POA: Diagnosis not present

## 2024-07-14 DIAGNOSIS — Z79899 Other long term (current) drug therapy: Secondary | ICD-10-CM | POA: Diagnosis not present

## 2024-08-07 DIAGNOSIS — Z6822 Body mass index (BMI) 22.0-22.9, adult: Secondary | ICD-10-CM | POA: Diagnosis not present

## 2024-08-07 DIAGNOSIS — F17210 Nicotine dependence, cigarettes, uncomplicated: Secondary | ICD-10-CM | POA: Diagnosis not present

## 2024-08-07 DIAGNOSIS — M545 Low back pain, unspecified: Secondary | ICD-10-CM | POA: Diagnosis not present

## 2024-08-07 DIAGNOSIS — D539 Nutritional anemia, unspecified: Secondary | ICD-10-CM | POA: Diagnosis not present

## 2024-08-07 DIAGNOSIS — F419 Anxiety disorder, unspecified: Secondary | ICD-10-CM | POA: Diagnosis not present

## 2024-08-07 DIAGNOSIS — Z79899 Other long term (current) drug therapy: Secondary | ICD-10-CM | POA: Diagnosis not present

## 2024-08-07 DIAGNOSIS — R195 Other fecal abnormalities: Secondary | ICD-10-CM | POA: Diagnosis not present

## 2024-08-07 DIAGNOSIS — M25571 Pain in right ankle and joints of right foot: Secondary | ICD-10-CM | POA: Diagnosis not present

## 2024-08-07 DIAGNOSIS — M25562 Pain in left knee: Secondary | ICD-10-CM | POA: Diagnosis not present

## 2024-08-11 DIAGNOSIS — G4733 Obstructive sleep apnea (adult) (pediatric): Secondary | ICD-10-CM | POA: Diagnosis not present

## 2024-08-11 DIAGNOSIS — I129 Hypertensive chronic kidney disease with stage 1 through stage 4 chronic kidney disease, or unspecified chronic kidney disease: Secondary | ICD-10-CM | POA: Diagnosis not present

## 2024-08-11 DIAGNOSIS — M1990 Unspecified osteoarthritis, unspecified site: Secondary | ICD-10-CM | POA: Diagnosis not present

## 2024-08-11 DIAGNOSIS — N181 Chronic kidney disease, stage 1: Secondary | ICD-10-CM | POA: Diagnosis not present

## 2024-08-11 DIAGNOSIS — Z823 Family history of stroke: Secondary | ICD-10-CM | POA: Diagnosis not present

## 2024-08-11 DIAGNOSIS — M519 Unspecified thoracic, thoracolumbar and lumbosacral intervertebral disc disorder: Secondary | ICD-10-CM | POA: Diagnosis not present

## 2024-08-11 DIAGNOSIS — J8410 Pulmonary fibrosis, unspecified: Secondary | ICD-10-CM | POA: Diagnosis not present

## 2024-08-11 DIAGNOSIS — Z9849 Cataract extraction status, unspecified eye: Secondary | ICD-10-CM | POA: Diagnosis not present

## 2024-08-11 DIAGNOSIS — K5900 Constipation, unspecified: Secondary | ICD-10-CM | POA: Diagnosis not present

## 2024-08-11 DIAGNOSIS — F324 Major depressive disorder, single episode, in partial remission: Secondary | ICD-10-CM | POA: Diagnosis not present

## 2024-08-11 DIAGNOSIS — F1120 Opioid dependence, uncomplicated: Secondary | ICD-10-CM | POA: Diagnosis not present

## 2024-08-11 DIAGNOSIS — G629 Polyneuropathy, unspecified: Secondary | ICD-10-CM | POA: Diagnosis not present

## 2024-08-11 DIAGNOSIS — Z9181 History of falling: Secondary | ICD-10-CM | POA: Diagnosis not present

## 2024-08-11 DIAGNOSIS — I700 Atherosclerosis of aorta: Secondary | ICD-10-CM | POA: Diagnosis not present

## 2024-08-11 DIAGNOSIS — F411 Generalized anxiety disorder: Secondary | ICD-10-CM | POA: Diagnosis not present

## 2024-08-11 DIAGNOSIS — J479 Bronchiectasis, uncomplicated: Secondary | ICD-10-CM | POA: Diagnosis not present

## 2024-08-11 DIAGNOSIS — I2510 Atherosclerotic heart disease of native coronary artery without angina pectoris: Secondary | ICD-10-CM | POA: Diagnosis not present

## 2024-08-11 DIAGNOSIS — M545 Low back pain, unspecified: Secondary | ICD-10-CM | POA: Diagnosis not present

## 2024-08-11 DIAGNOSIS — M810 Age-related osteoporosis without current pathological fracture: Secondary | ICD-10-CM | POA: Diagnosis not present

## 2024-08-11 DIAGNOSIS — E785 Hyperlipidemia, unspecified: Secondary | ICD-10-CM | POA: Diagnosis not present
